# Patient Record
Sex: Male | Born: 1980 | Race: Black or African American | Hispanic: No | Marital: Single | Smoking: Never smoker
Health system: Southern US, Community
[De-identification: ages and names within clinical notes are randomized; demographics above are authoritative.]

## PROBLEM LIST (undated history)

## (undated) DIAGNOSIS — I10 Essential (primary) hypertension: Secondary | ICD-10-CM

## (undated) HISTORY — PX: COLONOSCOPY: SHX174

---

## 2016-06-04 ENCOUNTER — Emergency Department (HOSPITAL_COMMUNITY)
Admission: EM | Admit: 2016-06-04 | Discharge: 2016-06-04 | Disposition: A | Discharge status: Home / Self Care | Payer: Self-pay | Attending: Emergency Medicine | Admitting: Emergency Medicine

## 2016-06-04 ENCOUNTER — Emergency Department (HOSPITAL_COMMUNITY): Payer: Self-pay

## 2016-06-04 ENCOUNTER — Encounter (HOSPITAL_COMMUNITY): Payer: Self-pay | Admitting: Emergency Medicine

## 2016-06-04 DIAGNOSIS — M7989 Other specified soft tissue disorders: Secondary | ICD-10-CM | POA: Insufficient documentation

## 2016-06-04 DIAGNOSIS — W208XXA Other cause of strike by thrown, projected or falling object, initial encounter: Secondary | ICD-10-CM | POA: Insufficient documentation

## 2016-06-04 DIAGNOSIS — Y990 Civilian activity done for income or pay: Secondary | ICD-10-CM | POA: Insufficient documentation

## 2016-06-04 DIAGNOSIS — Y9389 Activity, other specified: Secondary | ICD-10-CM | POA: Insufficient documentation

## 2016-06-04 DIAGNOSIS — R609 Edema, unspecified: Secondary | ICD-10-CM

## 2016-06-04 DIAGNOSIS — Y929 Unspecified place or not applicable: Secondary | ICD-10-CM | POA: Insufficient documentation

## 2016-06-04 DIAGNOSIS — S99922A Unspecified injury of left foot, initial encounter: Secondary | ICD-10-CM | POA: Insufficient documentation

## 2016-06-04 MED ORDER — LIDOCAINE HCL (PF) 1 % IJ SOLN
5.0000 mL | Freq: Once | INTRAMUSCULAR | Status: AC
  Administered 2016-06-04: 5 mL via INTRADERMAL
  Filled 2016-06-04: qty 30

## 2016-06-04 MED ORDER — IBUPROFEN 600 MG PO TABS: 600.0000 mg | ORAL_TABLET | Freq: Four times a day (QID) | ORAL | 0 refills | Status: AC | PRN

## 2016-06-04 MED ORDER — IBUPROFEN 800 MG PO TABS
800.0000 mg | ORAL_TABLET | Freq: Once | ORAL | Status: AC
  Administered 2016-06-04: 800 mg via ORAL
  Filled 2016-06-04: qty 1

## 2016-06-04 MED ORDER — CIPROFLOXACIN HCL 500 MG PO TABS: 500.0000 mg | ORAL_TABLET | Freq: Two times a day (BID) | ORAL | 0 refills | Status: AC

## 2016-06-04 NOTE — Discharge Instructions (Signed)
Take your antibiotics as prescribed until you have completed the prescription. He may continue taking Tylenol or ibuprofen as prescribed over-the-counter as needed for pain relief. Keep wound clean using Dial antibacterial soap and water, pat dry.  Please follow up with a primary care provider from the Resource Guide provided below in one week for wound recheck.  Please return to the Emergency Department if symptoms worsen or new onset of fever, redness, swelling, warmth, drainage, numbness, tingling, weakness.

## 2016-06-04 NOTE — ED Provider Notes (Signed)
[facility]-EMERGENCY DEPT Provider Note   CSN: [id number] Arrival date & time: 06/04/16  2005     History   Chief Complaint Chief Complaint  Patient presents with  . Toe Injury    HPI Anthony Bradford is a 35 y.o. male.  Patient is a [age] year old male with no prior past medical history presents the ED with complaint of left great toe pain, onset 2 days. Patient reports 3 days ago at work he dropped a cement block on his left foot that landed on his left big toe. He reports over the past 2 days he has had worsening pain and swelling to his left great toe. Patient states he has been taking ibuprofen at home with mild intermittent relief and states he has been soaking his foot in Epsom salt. Denies fever, drainage, bleeding, redness, warmth, numbness, tingling, weakness. Tetanus up-to-date.      History reviewed. No pertinent past medical history.  There are no active problems to display for this patient.   History reviewed. No pertinent surgical history.     Home Medications    Prior to Admission medications   Medication Sig Start Date End Date Taking? Authorizing Provider  acetaminophen (TYLENOL) 325 MG tablet Take 325 mg by mouth every 6 (six) hours as needed for moderate pain.   Yes Historical Provider, MD  ciprofloxacin (CIPRO) 500 MG tablet Take 1 tablet (500 mg total) by mouth 2 (two) times daily. 06/04/16   Barrett HenleNicole Elizabeth Iram Astorino, PA-C  ibuprofen (ADVIL,MOTRIN) 600 MG tablet Take 1 tablet (600 mg total) by mouth every 6 (six) hours as needed. 06/04/16   Barrett HenleNicole Elizabeth Adrena Nakamura, PA-C    Family History No family history on file.  Social History Social History  Substance Use Topics  . Smoking status: Not on file  . Smokeless tobacco: Not on file  . Alcohol use Not on file     Allergies   Review of patient's allergies indicates no known allergies.   Review of Systems Review of Systems  Constitutional: Negative for fever.  Musculoskeletal: Positive for arthralgias  (left big toe) and joint swelling.  Skin: Positive for color change and wound.  Neurological: Negative for weakness and numbness.     Physical Exam Updated Vital Signs BP 134/92 (BP Location: Left Arm)   Pulse 62   Temp 98.1 F (36.7 C) (Oral)   Resp 19   SpO2 97%   Physical Exam  Constitutional: He is oriented to person, place, and time. He appears well-developed and well-nourished.  HENT:  Head: Normocephalic and atraumatic.  Eyes: Conjunctivae and EOM are normal. Right eye exhibits no discharge. Left eye exhibits no discharge. No scleral icterus.  Neck: Normal range of motion. Neck supple.  Cardiovascular: Normal rate and intact distal pulses.   Pulmonary/Chest: Effort normal. No respiratory distress.  Abdominal: He exhibits no distension.  Musculoskeletal: Normal range of motion. He exhibits tenderness.  Blood filled bulla noted to proximal nail bed of left great toe, TTP. Subungual hematoma noted, nontender. Nail is loose on edges but otherwise grossly intact. Dec ROM of left 1st PIP joint due to pain. Sensation grossly intact. 2+ DP pulse. Pt able to stand and ambulate.  Neurological: He is alert and oriented to person, place, and time.  Skin: Skin is warm and dry.  Nursing note and vitals reviewed.      ED Treatments / Results  Labs (all labs ordered are listed, but only abnormal results are displayed) Labs Reviewed - No data to display  EKG  EKG Interpretation None       Radiology Dg Toe Great Left  Result Date: 06/04/2016 CLINICAL DATA:  Center block fell on left foot. Left big toe swelling and erythema. Purulent drainage at the nail bed. EXAM: LEFT GREAT TOE COMPARISON:  None. FINDINGS: There is soft tissue swelling of the big toe, greatest at the nailbed. There is no fracture or dislocation. No ostial axis. IMPRESSION: No fracture or dislocation of the left great toe. No evidence of active osteitis. Moderate soft tissue swelling. Electronically Signed   By:  Deatra Robinson M.D.   On: 06/04/2016 21:32    Procedures .Nerve Block Date/Time: 06/04/2016 10:43 PM Performed by: [name] Authorized by: [name]   Consent:    Consent obtained:  Verbal   Consent given by:  Patient Indications:    Indications:  Procedural anesthesia Location:    Body area:  Lower extremity   Lower extremity nerve blocked: left great toe.   Laterality:  Left Pre-procedure details:    Skin preparation:  Povidone-iodine Procedure details (see MAR for exact dosages):    Block needle gauge:  25 G   Anesthetic injected:  Lidocaine 1% w/o epi   Injection procedure:  Anatomic landmarks identified Post-procedure details:    Outcome:  Pain improved   Patient tolerance of procedure:  Procedure terminated at patient's request ..Incision and Drainage Date/Time: 06/04/2016 10:44 PM Performed by: [name] Authorized by: [name]   Consent:    Consent obtained:  Verbal   Consent given by:  Patient Location:    Type:  Bulla   Size:  2cm   Location:  Lower extremity   Lower extremity location:  Toe   Toe location:  L big toe Pre-procedure details:    Skin preparation:  Betadine Anesthesia (see MAR for exact dosages):    Anesthesia method:  Nerve block   Block location:  Left great tow digital    Block needle gauge:  25 G   Block anesthetic:  Lidocaine 1% w/o epi   Block injection procedure:  Anatomic landmarks identified   Block outcome:  Incomplete block (due to pt not tolerating entire procedure) Procedure type:    Complexity:  Simple Post-procedure details:    Patient tolerance of procedure:  Procedure terminated at patient's request   (including critical care time)  Medications Ordered in ED Medications  ibuprofen (ADVIL,MOTRIN) tablet 800 mg (800 mg Oral Given 06/04/16 2051)  lidocaine (PF) (XYLOCAINE) 1 % injection 5 mL (5 mLs Intradermal Given 06/04/16 2115)     Initial Impression /  Assessment and Plan / ED Course  I have reviewed the triage vital signs and the nursing notes.  Pertinent labs & imaging results that were available during my care of the patient were reviewed by me and considered in my medical decision making (see chart for details).  Clinical Course    Patient presents with left big toe pain that started after he dropped a cement block on his foot 2 days ago. Reports worsening swelling and bruising. Tetanus up-to-date. VSS. Exam revealed large blood filled bulla to left first proximal nail bed with subungual hematoma noted, toenail is loose but grossly intact. Left foot neurovascularly intact. Left great toe x-ray negative. Attempted to perform digital block however was unable to fully block patient's digit for I&D of bulla due to patient not tolerating procedure. I was able to I&D bulla with partial block without any complications. Dressing applied to wound. Will d/c pt  home with keflex and wound care. Suspect pt will likely lose his toe nail which I discussed with pt. Advised pt to follow up with PCP. Discussed return precautions with pt.   Final Clinical Impressions(s) / ED Diagnoses   Final diagnoses:  Swelling  Toe injury, left, initial encounter    New Prescriptions Discharge Medication List as of 06/04/2016 10:43 PM    START taking these medications   Details  ciprofloxacin (CIPRO) 500 MG tablet Take 1 tablet (500 mg total) by mouth 2 (two) times daily., Starting Fri 06/04/2016, Print    ibuprofen (ADVIL,MOTRIN) 600 MG tablet Take 1 tablet (600 mg total) by mouth every 6 (six) hours as needed., Starting Fri 06/04/2016, Print         Satira Sark Vilas, [place] 06/05/16 8119    Lorre Nick, MD 06/06/16 [phone]

## 2016-06-04 NOTE — ED Triage Notes (Signed)
Per EMS pt dropped a cement cenderblook on left foot on Wednesday. Pt soaked foot in epson salt, then put icy hot on it. Pts left big toe is now swollen, purple, and pus around the nail bed. Pt denies taking any medication for pain. Pain 9/10.

## 2016-06-04 NOTE — ED Notes (Signed)
Patient transported to DG. 

## 2016-06-04 NOTE — ED Notes (Signed)
Clean dressing applied over left big toe.

## 2016-06-04 NOTE — ED Notes (Signed)
Bed: [id number] Expected date:  Expected time:  Means of arrival:  Comments: EMS/foot injury

## 2019-03-03 ENCOUNTER — Encounter (HOSPITAL_COMMUNITY): Payer: Self-pay

## 2019-03-03 ENCOUNTER — Emergency Department (HOSPITAL_COMMUNITY)
Admission: EM | Admit: 2019-03-03 | Discharge: 2019-03-03 | Disposition: A | Discharge status: Home / Self Care | Payer: Self-pay | Attending: Emergency Medicine | Admitting: Emergency Medicine

## 2019-03-03 DIAGNOSIS — H029 Unspecified disorder of eyelid: Secondary | ICD-10-CM | POA: Insufficient documentation

## 2019-03-03 MED ORDER — ERYTHROMYCIN 5 MG/GM OP OINT
1.0000 | TOPICAL_OINTMENT | Freq: Once | OPHTHALMIC | Status: AC
Start: 2019-03-03 — End: 2019-03-03
  Administered 2019-03-03: 1 via OPHTHALMIC
  Filled 2019-03-03: qty 3.5

## 2019-03-03 NOTE — ED Triage Notes (Signed)
Pt states that for the past three days his R eye has been red and painful

## 2019-03-03 NOTE — Discharge Instructions (Addendum)
Thank you for allowing me to care for you today in the Emergency Department.   Apply 1/2 inch ribbon ointment along the lower eyelid every 6 hours while you are awake for the next 4 to 5 days.  Apply a warm washcloth or compress to the right eye for 15 to 20 minutes at least 3-4 times a day.  Try to gently massage the area where the "bump" is located on your eye.  Most likely, this is a clogged tear duct and should resolve with just warm compresses and massages alone.  If the bump does not go away after using the massages and warm compress, call and schedule follow-up appointment with Dr. Allena Katz.  Dr. Allena Katz is an eye doctor.  Your blood pressure was high today.  Please have it rechecked within the next 1 to 2 weeks.  Return to the emergency department if you develop fevers, chills, and a area around the eye gets very red and hot to the touch, if you start having thick, mucus-like drainage constantly coming out of the eye, if you start to have severe pain with movement of the eye in all directions, or other new, concerning symptoms.

## 2019-03-03 NOTE — ED Provider Notes (Signed)
[facility] Provider Note   CSN: [id number] Arrival date & time: 03/03/19  2212    History   Chief Complaint Chief Complaint  Patient presents with  . Conjunctivitis    HPI Anthony Bradford is a 38 y.o. male presents with a chief complaint of right eye pain.  Patient reports that approximately 3 days ago that some of his significant other's hair "grease" product ended up in his right eye.  He reports that he immediately wrapped to the eye and then flushed it out with water.  He reports that he then noticed a "bump" at the medial aspect of the eye.  Denies a history of similar.  He reports the nontender unless he is pressing on it.  He treated the bump by running warm water over the area. He has been rubbing the area as it has been irritated. He noted a "stinging" pain that lasts for approximately a second when he looks at the left, toward his nose, which then immediately resolves.   He denies fever, chills, eye redness or drainage.  No left eye complaints.  No facial swelling, diplopia, blurred vision, or foreign body sensation.  He does not recall if he has produced any tears over the last few days.  He does not wear contacts or glasses.      The history is provided by the patient. No language interpreter was used.    History reviewed. No pertinent past medical history.  There are no active problems to display for this patient.   History reviewed. No pertinent surgical history.      Home Medications    Prior to Admission medications   Medication Sig Start Date End Date Taking? Authorizing Provider  acetaminophen (TYLENOL) 325 MG tablet Take 325 mg by mouth every 6 (six) hours as needed for moderate pain.    [provider]  ciprofloxacin (CIPRO) 500 MG tablet Take 1 tablet (500 mg total) by mouth 2 (two) times daily. 06/04/16   Barrett HenleNadeau, Nicole Elizabeth, PA-C  ibuprofen (ADVIL,MOTRIN) 600 MG tablet Take 1 tablet (600 mg total) by  mouth every 6 (six) hours as needed. 06/04/16   Barrett HenleNadeau, Nicole Elizabeth, PA-C    Family History No family history on file.  Social History Social History   Tobacco Use  . Smoking status: Not on file  Substance Use Topics  . Alcohol use: Not on file  . Drug use: Not on file     Allergies   Patient has no known allergies.   Review of Systems Review of Systems  Constitutional: Negative for activity change, chills and fever.  HENT: Negative for congestion, facial swelling and sore throat.   Eyes: Positive for pain. Negative for photophobia, discharge, redness, itching and visual disturbance.  Gastrointestinal: Negative for diarrhea, nausea and vomiting.  Musculoskeletal: Negative for back pain.  Skin: Negative for rash.       Mass  Neurological: Negative for dizziness, weakness and numbness.   Physical Exam Updated Vital Signs BP (!) 174/113 (BP Location: Right Arm)   Pulse 87   Temp 98.9 F (37.2 C)   Resp 18   SpO2 98%   Physical Exam Vitals signs and nursing note reviewed.  Constitutional:      Appearance: He is well-developed.  HENT:     Head: Normocephalic.  Eyes:     General: Lids are normal. Lids are everted, no foreign bodies appreciated. Vision grossly intact.        Right eye: No  foreign body or discharge.        Left eye: No foreign body or discharge.     Extraocular Movements: Extraocular movements intact.     Conjunctiva/sclera: Conjunctivae normal.     Right eye: Right conjunctiva is not injected. No chemosis, exudate or hemorrhage.    Left eye: Left conjunctiva is not injected. No chemosis, exudate or hemorrhage.    Pupils: Pupils are equal, round, and reactive to light.     Funduscopic exam:    Right eye: No hemorrhage or exudate. Red reflex present.        Left eye: No hemorrhage or exudate. Red reflex present.    Visual Fields: Right eye visual fields normal and left eye visual fields normal.      Comments: 1-2 mm hard, mobile mass noted near  the medial canthus of the right eye. Minimal discomfort with palpation. No tearing or purulence noted with massage.  No surrounding erythema, warmth, or edema.  There is no fluctuance or induration.   Neck:     Musculoskeletal: Neck supple.  Cardiovascular:     Rate and Rhythm: Normal rate and regular rhythm.     Heart sounds: No murmur.  Pulmonary:     Effort: Pulmonary effort is normal.  Abdominal:     General: There is no distension.     Palpations: Abdomen is soft.  Skin:    General: Skin is warm and dry.  Neurological:     Mental Status: He is alert.  Psychiatric:        Behavior: Behavior normal.      ED Treatments / Results  Labs (all labs ordered are listed, but only abnormal results are displayed) Labs Reviewed - No data to display  EKG None  Radiology No results found.  Procedures Procedures (including critical care time)  Medications Ordered in ED Medications  erythromycin ophthalmic ointment 1 application (has no administration in time range)     Initial Impression / Assessment and Plan / ED Course  I have reviewed the triage vital signs and the nursing notes.  Pertinent labs & imaging results that were available during my care of the patient were reviewed by me and considered in my medical decision making (see chart for details).        38 year old male with no pertinent past medical history presenting with a mobile mass near the medial canthus of the right eye that he first noticed 3 days ago after he accidentally rubbed some of his significant other's hair product in his eye.  No constitutional symptoms.  On exam, there is a 1 to 2 mm circular hard mobile near the medial canthus.  Conjunctive is normal.  Extraocular movements are intact.  Pupils are equal round and reactive.  He is not endorsing any changes to his vision or foreign body sensation.  Differential diagnosis includes dacryocystitis, dermoid cyst, basal cell carcinoma.   I have a lower  suspicion for infection given that there is no fluctuance or induration, including septal or preseptal cellulitis.  Will discharge with warm compresses and massages.  Will cover for infection with erythromycin since the patient does not wear contact lenses.  We will provide the patient with a follow-up to ophthalmology if symptoms do not resolve in the next 4 to 5 days.  He was also given strict return precautions to the ER.  He is hemodynamically stable and in no acute distress.  He was also advised to have his blood pressure rechecked in the next  week.  Doubt hypertensive urgency or emergency as the patient is asymptomatic.  Safe for discharge home with outpatient follow-up at this time.  Final Clinical Impressions(s) / ED Diagnoses   Final diagnoses:  Lesion of canthus    ED Discharge Orders    None       Barkley Boards, PA-C 03/03/19 2329    Shaune Pollack, MD 03/05/19 1027

## 2019-08-23 ENCOUNTER — Telehealth: Payer: Self-pay | Admitting: Physician Assistant

## 2019-08-23 ENCOUNTER — Emergency Department (HOSPITAL_COMMUNITY)
Admission: EM | Admit: 2019-08-23 | Discharge: 2019-08-23 | Disposition: A | Discharge status: Home / Self Care | Payer: Self-pay | Attending: Emergency Medicine | Admitting: Emergency Medicine

## 2019-08-23 ENCOUNTER — Emergency Department (HOSPITAL_COMMUNITY): Payer: Self-pay

## 2019-08-23 ENCOUNTER — Other Ambulatory Visit: Payer: Self-pay

## 2019-08-23 ENCOUNTER — Encounter: Payer: Self-pay | Admitting: Physician Assistant

## 2019-08-23 ENCOUNTER — Encounter (HOSPITAL_COMMUNITY): Payer: Self-pay | Admitting: Emergency Medicine

## 2019-08-23 DIAGNOSIS — K625 Hemorrhage of anus and rectum: Secondary | ICD-10-CM | POA: Insufficient documentation

## 2019-08-23 DIAGNOSIS — R1030 Lower abdominal pain, unspecified: Secondary | ICD-10-CM | POA: Insufficient documentation

## 2019-08-23 LAB — CBC
HCT: 34.8 % — ABNORMAL LOW (ref 39.0–52.0)
Hemoglobin: 11.1 g/dL — ABNORMAL LOW (ref 13.0–17.0)
MCH: 27.8 pg (ref 26.0–34.0)
MCHC: 31.9 g/dL (ref 30.0–36.0)
MCV: 87.2 fL (ref 80.0–100.0)
Platelets: 198 10*3/uL (ref 150–400)
RBC: 3.99 MIL/uL — ABNORMAL LOW (ref 4.22–5.81)
RDW: 15.8 % — ABNORMAL HIGH (ref 11.5–15.5)
WBC: 5.6 10*3/uL (ref 4.0–10.5)
nRBC: 0 % (ref 0.0–0.2)

## 2019-08-23 LAB — URINALYSIS, ROUTINE W REFLEX MICROSCOPIC
Bilirubin Urine: NEGATIVE
Glucose, UA: NEGATIVE mg/dL
Hgb urine dipstick: NEGATIVE
Ketones, ur: NEGATIVE mg/dL
Leukocytes,Ua: NEGATIVE
Nitrite: NEGATIVE
Protein, ur: NEGATIVE mg/dL
Specific Gravity, Urine: 1.027 (ref 1.005–1.030)
pH: 5 (ref 5.0–8.0)

## 2019-08-23 LAB — COMPREHENSIVE METABOLIC PANEL
ALT: 18 U/L (ref 0–44)
AST: 26 U/L (ref 15–41)
Albumin: 3.6 g/dL (ref 3.5–5.0)
Alkaline Phosphatase: 51 U/L (ref 38–126)
Anion gap: 10 (ref 5–15)
BUN: 6 mg/dL (ref 6–20)
CO2: 25 mmol/L (ref 22–32)
Calcium: 9 mg/dL (ref 8.9–10.3)
Chloride: 104 mmol/L (ref 98–111)
Creatinine, Ser: 1.12 mg/dL (ref 0.61–1.24)
GFR calc Af Amer: >60 mL/min (ref >60)
GFR calc non Af Amer: >60 mL/min (ref >60)
Glucose, Bld: 146 mg/dL — ABNORMAL HIGH (ref 70–99)
Potassium: 3.6 mmol/L (ref 3.5–5.1)
Sodium: 139 mmol/L (ref 135–145)
Total Bilirubin: 0.4 mg/dL (ref 0.3–1.2)
Total Protein: 6.2 g/dL — ABNORMAL LOW (ref 6.5–8.1)

## 2019-08-23 LAB — TYPE AND SCREEN
ABO/RH(D): A POS
Antibody Screen: NEGATIVE

## 2019-08-23 LAB — ABO/RH: ABO/RH(D): A POS

## 2019-08-23 LAB — POC OCCULT BLOOD, ED: Fecal Occult Bld: POSITIVE — AB

## 2019-08-23 MED ORDER — IOHEXOL 300 MG/ML  SOLN
100.0000 mL | Freq: Once | INTRAMUSCULAR | Status: AC
  Administered 2019-08-23: 09:00:00 100 mL via INTRAVENOUS

## 2019-08-23 MED ORDER — SENNOSIDES-DOCUSATE SODIUM 8.6-50 MG PO TABS: 1.0000 | ORAL_TABLET | Freq: Every evening | ORAL | 0 refills | Status: AC | PRN

## 2019-08-23 MED ORDER — HYDROCORTISONE ACETATE 25 MG RE SUPP: 25.0000 mg | Freq: Two times a day (BID) | RECTAL | 0 refills | Status: AC

## 2019-08-23 NOTE — ED Triage Notes (Addendum)
Pt reports intermittent lower abd cramping/pain that has been going on for a few years but has gotten worse over the past two weeks. Pt reports once the pain happens he starts having bright red and dark red blood out of his rectum with no bm. Pt reports he has been bleeding constantly for two weeks and now starting to feel generalized weakness. 10/10 rectal pain. No n/v/d. No urinary complaints. Pt has been checked by doctors, had colonoscopies done with no answers.

## 2019-08-23 NOTE — Discharge Instructions (Signed)
You were seen in the emergency department today with rectal bleeding and abdominal pain.  Your CT scan and lab work is reassuring.  I am treating you for hemorrhoids with steroid suppository and will have you start taking medication for constipation.  If your bleeding significantly worsens or you become lightheaded, shortness of breath, or nearly passing out I would like for you to return to the emergency department.  I have placed a referral to gastroenterology.  They may require that this comes from your primary care doctor but please call today to schedule the next available appointment.

## 2019-08-23 NOTE — Telephone Encounter (Signed)
.  A user error has taken place: error

## 2019-08-23 NOTE — ED Notes (Signed)
Patient transported to CT 

## 2019-08-23 NOTE — ED Provider Notes (Signed)
Emergency Department Provider Note   I have reviewed the triage vital signs and the nursing notes.   HISTORY  Chief Complaint Abdominal Pain and GI Bleeding   HPI Anthony Bradford is a 38 y.o. male with past history of chronic rectal bleeding from unknown source presents to the emergency department with breakthrough rectal bleeding and rectal pain.  Patient states that the problems been going on for "years" on and off.  He describes initial discomfort in his abdomen like he needs to have a bowel movement.  He states he goes to the bathroom and moves his bowels but notices a large amount of bright red blood.  He has pain afterwards with bleeding and discomfort.  Patient recently moved from [place] and states that prior to moving here he had seen a gastroenterologist and had colonoscopies.  He states that no one could find a clear source for his bleeding.  Patient states that symptoms returned 2 to 3 days ago and have been persistent.  He has developed some generalized weakness and occasional tingling into both legs.  No weakness or numbness symptoms.  Denies chest pain or shortness of breath.  No vomiting or diarrhea.   History reviewed. No pertinent past medical history.  There are no active problems to display for this patient.   Past Surgical History:  Procedure Laterality Date   COLONOSCOPY      Allergies Patient has no known allergies.  No family history on file.  Social History Social History   Tobacco Use   Smoking status: Never Smoker   Smokeless tobacco: Never Used  Substance Use Topics   Alcohol use: Never    Frequency: Never   Drug use: Never    Review of Systems  Constitutional: No fever/chills Eyes: No visual changes. ENT: No sore throat. Cardiovascular: Denies chest pain. Respiratory: Denies shortness of breath. Gastrointestinal: Cramping abdominal pain. Positive rectal pain. No nausea, no vomiting.  No diarrhea.  No constipation. Positive BRBPR.    Genitourinary: Negative for dysuria. Musculoskeletal: Negative for back pain. Skin: Negative for rash. Neurological: Negative for headaches, focal weakness or numbness.  10-point ROS otherwise negative.  ____________________________________________   PHYSICAL EXAM:  VITAL SIGNS: ED Triage Vitals  Enc Vitals Group     BP 08/23/19 0225 (!) 159/98     Pulse Rate 08/23/19 0225 88     Resp 08/23/19 0225 20     Temp 08/23/19 0225 98.3 F (36.8 C)     Temp Source 08/23/19 0225 Oral     SpO2 08/23/19 0225 98 %     Weight 08/23/19 0242 200 lb (90.7 kg)     Height 08/23/19 0242 6\' 6"  (1.981 m)   Constitutional: Alert and oriented. Well appearing and in no acute distress. Eyes: Conjunctivae are normal.  Head: Atraumatic. Nose: No congestion/rhinnorhea. Mouth/Throat: Mucous membranes are moist.   Neck: No stridor.   Cardiovascular: Normal rate, regular rhythm. Good peripheral circulation. Grossly normal heart sounds.   Respiratory: Normal respiratory effort.  No retractions. Lungs CTAB. Gastrointestinal: Soft with mild LLQ abdominal tenderness. No rebound or guarding. No distention. Rectal exam performed with nurse tech chaperone and with patient's verbal consent. Small, non-thrombosed hemorrhoid with no visible active bleeding. BPB on exam finger, however. No fissures. No visible peri-rectal abscess.  Musculoskeletal: No lower extremity tenderness nor edema. No gross deformities of extremities. Neurologic:  Normal speech and language.  Skin:  Skin is warm, dry and intact. No rash noted.   ____________________________________________   (  all labs ordered are listed, but only abnormal results are displayed)  Labs Reviewed  COMPREHENSIVE METABOLIC PANEL - Abnormal; Notable for the following components:      Result Value   Glucose, Bld 146 (*)    Total Protein 6.2 (*)    All other components within normal limits  CBC - Abnormal; Notable for the following components:   RBC  3.99 (*)    Hemoglobin 11.1 (*)    HCT 34.8 (*)    RDW 15.8 (*)    All other components within normal limits  URINALYSIS, ROUTINE W REFLEX MICROSCOPIC - Abnormal; Notable for the following components:   APPearance HAZY (*)    All other components within normal limits  POC OCCULT BLOOD, ED - Abnormal; Notable for the following components:   Fecal Occult Bld POSITIVE (*)    All other components within normal limits  TYPE AND SCREEN  ABO/RH   ____________________________________________  RADIOLOGY  Ct Abdomen Pelvis W Contrast  Result Date: 08/23/2019 CLINICAL DATA:  Acute abdominal pain. Generalized rectal pain and GI bleeding. GI bleeding symptoms for 2 weeks. EXAM: CT ABDOMEN AND PELVIS WITH CONTRAST TECHNIQUE: Multidetector CT imaging of the abdomen and pelvis was performed using the standard protocol following bolus administration of intravenous contrast. CONTRAST:  <See Chart> OMNIPAQUE IOHEXOL 300 MG/ML  SOLN COMPARISON:  None. FINDINGS: Lower chest: Clear lung bases. Hepatobiliary: No focal liver abnormality is seen. No gallstones, gallbladder wall thickening, or biliary dilatation. Pancreas: Unremarkable. No pancreatic ductal dilatation or surrounding inflammatory changes. Spleen: Normal in size without focal abnormality. Adrenals/Urinary Tract: Adrenal glands are unremarkable. Kidneys are normal, without renal calculi, focal lesion, or hydronephrosis. Bladder is unremarkable. Stomach/Bowel: Moderate increased stool noted throughout the colon. No colonic wall thickening or inflammation. No mass or abnormality noted of the rectum. Stomach and small bowel are unremarkable. Normal appendix visualized. Vascular/Lymphatic: Minimal aortic atherosclerosis. No other vascular abnormality. No enlarged lymph nodes. Reproductive: Unremarkable. Other: No abdominal wall hernia or abnormality. Trace free fluid in the posterior pelvic recess. Musculoskeletal: No acute or significant osseous findings.  IMPRESSION: 1. No acute findings. 2. Moderate increased stool burden throughout the colon. No bowel inflammation. No CT abnormality of the rectum. Electronically Signed   By: Lajean Manes M.D.   On: 08/23/2019 09:33    ____________________________________________   PROCEDURES  Procedure(s) performed:   Procedures  None  ____________________________________________   INITIAL IMPRESSION / ASSESSMENT AND PLAN / ED COURSE  Pertinent labs & imaging results that were available during my care of the patient were reviewed by me and considered in my medical decision making (see chart for details).   Patient presents to the emergency department with acute on chronic rectal bleeding.  Mostly bright red on my exam without melena.  There is a small, nonthrombosed hemorrhoid visible which could be the source of bleeding however patient does have some left lower quadrant tenderness as well.  Plan for CT imaging to evaluate for underlying colitis.  If negative, plan to treat hemorrhoids and refer to GI.  Patient's hemoglobin is 11.1 and not requiring PRBC transfusion at this time.  Patient is not tachycardic or hypotensive.   09:55 AM  CT scan reviewed.  There is evidence of constipation but no colitis type picture or obvious abscess.  Suspicion for infectious process is exceedingly low.  Plan for steroid suppository and constipation management at home as hemorrhoids may be playing a role here.  I placed a referral to gastroenterology and asked the patient to  call today to schedule the next available appointment.  He is also establishing care with a primary care doctor.  Discussed ED return precautions in detail. Patient comfortable with the plan at discharge.  ____________________________________________  FINAL CLINICAL IMPRESSION(S) / ED DIAGNOSES  Final diagnoses:  Rectal bleeding  Lower abdominal pain     MEDICATIONS GIVEN DURING THIS VISIT:  Medications  iohexol (OMNIPAQUE) 300 MG/ML  solution 100 mL (100 mLs Intravenous Contrast Given 08/23/19 0912)     NEW OUTPATIENT MEDICATIONS STARTED DURING THIS VISIT:  New Prescriptions   HYDROCORTISONE (ANUSOL-HC) 25 MG SUPPOSITORY    Place 1 suppository (25 mg total) rectally 2 (two) times daily for 7 days.   SENNA-DOCUSATE (SENOKOT-S) 8.6-50 MG TABLET    Take 1 tablet by mouth at bedtime as needed for mild constipation or moderate constipation.    Note:  This document was prepared using Dragon voice recognition software and may include unintentional dictation errors.  Alona BeneJoshua Atalaya Zappia, MD, [facility] Emergency Medicine    Raymon Schlarb, Arlyss RepressJoshua G, MD 08/23/19 [phone]

## 2019-09-04 ENCOUNTER — Ambulatory Visit: Payer: Self-pay | Admitting: Physician Assistant

## 2019-12-16 ENCOUNTER — Encounter (HOSPITAL_COMMUNITY): Payer: Self-pay | Admitting: Emergency Medicine

## 2019-12-16 ENCOUNTER — Emergency Department (HOSPITAL_COMMUNITY)
Admission: EM | Admit: 2019-12-16 | Discharge: 2019-12-17 | Disposition: A | Discharge status: Home / Self Care | Payer: Self-pay | Attending: Emergency Medicine | Admitting: Emergency Medicine

## 2019-12-16 ENCOUNTER — Emergency Department (HOSPITAL_COMMUNITY): Payer: Self-pay

## 2019-12-16 ENCOUNTER — Other Ambulatory Visit: Payer: Self-pay

## 2019-12-16 DIAGNOSIS — I10 Essential (primary) hypertension: Secondary | ICD-10-CM | POA: Insufficient documentation

## 2019-12-16 DIAGNOSIS — R0789 Other chest pain: Secondary | ICD-10-CM | POA: Insufficient documentation

## 2019-12-16 HISTORY — DX: Essential (primary) hypertension: I10

## 2019-12-16 LAB — BASIC METABOLIC PANEL
Anion gap: 10 (ref 5–15)
BUN: 6 mg/dL (ref 6–20)
CO2: 28 mmol/L (ref 22–32)
Calcium: 9.6 mg/dL (ref 8.9–10.3)
Chloride: 100 mmol/L (ref 98–111)
Creatinine, Ser: 1.03 mg/dL (ref 0.61–1.24)
GFR calc Af Amer: >60 mL/min (ref >60)
GFR calc non Af Amer: >60 mL/min (ref >60)
Glucose, Bld: 119 mg/dL — ABNORMAL HIGH (ref 70–99)
Potassium: 3.6 mmol/L (ref 3.5–5.1)
Sodium: 138 mmol/L (ref 135–145)

## 2019-12-16 LAB — CBC
HCT: 41.5 % (ref 39.0–52.0)
Hemoglobin: 13.3 g/dL (ref 13.0–17.0)
MCH: 28.1 pg (ref 26.0–34.0)
MCHC: 32 g/dL (ref 30.0–36.0)
MCV: 87.6 fL (ref 80.0–100.0)
Platelets: 237 10*3/uL (ref 150–400)
RBC: 4.74 MIL/uL (ref 4.22–5.81)
RDW: 15 % (ref 11.5–15.5)
WBC: 5.3 10*3/uL (ref 4.0–10.5)
nRBC: 0 % (ref 0.0–0.2)

## 2019-12-16 LAB — TROPONIN I (HIGH SENSITIVITY): Troponin I (High Sensitivity): 11 ng/L (ref <18)

## 2019-12-16 MED ORDER — SODIUM CHLORIDE 0.9% FLUSH: 3.0000 mL | Freq: Once | INTRAVENOUS | Status: DC

## 2019-12-16 NOTE — ED Triage Notes (Signed)
Pt reports left sided chest pain (heaviness or cramping) radiating to left arm and neck. States "I had a heart thing but I beat it." Unable to get any other info.  Started a week ago but "got real bad tonight."  Mother passed away last week b/c of "heart problems"

## 2019-12-17 NOTE — Discharge Instructions (Signed)
You may use over-the-counter Motrin (Ibuprofen), Acetaminophen (Tylenol), topical muscle creams such as SalonPas, Icy Hot, Bengay, etc. Please stretch, apply heat, and have massage therapy for additional assistance. ° °

## 2019-12-17 NOTE — ED Provider Notes (Signed)
[facility] EMERGENCY DEPARTMENT Provider Note  CSN: [id number] Arrival date & time: 12/16/19 2145  Chief Complaint(s) Chest Pain  HPI Anthony Bradford is a 39 y.o. male with a history of hypertension who presents to the emergency department with 1-1/2 weeks of constant left upper back and left upper chest pain that has been fluctuating in nature.  Exacerbated with range of motion of the left arm.  Pain now radiating up his neck down into his arm and associated with intermittent paresthesias.  Patient denies any trauma.  No shortness of breath.  No nausea or vomiting.  Pain is not exertional.  Some improvement with massages.  No other alleviating or aggravating factors.  No recent fevers or infections.  No cough or congestion. No other physical complaints.  HPI  Past Medical History Past Medical History:  Diagnosis Date  . Hypertension    There are no problems to display for this patient.  Home Medication(s) Prior to Admission medications   Medication Sig Start Date End Date Taking? Authorizing Provider  ciprofloxacin (CIPRO) 500 MG tablet Take 1 tablet (500 mg total) by mouth 2 (two) times daily. Patient not taking: Reported on 12/16/2019 06/04/16   [name], PA-C  ibuprofen (ADVIL,MOTRIN) 600 MG tablet Take 1 tablet (600 mg total) by mouth every 6 (six) hours as needed. Patient not taking: Reported on 12/16/2019 06/04/16   [name], PA-C  senna-docusate (SENOKOT-S) 8.6-50 MG tablet Take 1 tablet by mouth at bedtime as needed for mild constipation or moderate constipation. Patient not taking: Reported on 12/16/2019 08/23/19   Long, [name], MD                                                                                                                                    Past Surgical History Past Surgical History:  Procedure Laterality Date  . COLONOSCOPY     Family History No family history on file.  Social History Social History    Tobacco Use  . Smoking status: Never Smoker  . Smokeless tobacco: Never Used  Substance Use Topics  . Alcohol use: Never  . Drug use: Never   Allergies Patient has no known allergies.  Review of Systems Review of Systems All other systems are reviewed and are negative for acute change except as noted in the HPI  Physical Exam Vital Signs  I have reviewed the triage vital signs BP 140/86   Pulse 68   Temp 97.6 F (36.4 C) (Oral)   Resp 17   Ht 6\' 6"  (1.981 m)   Wt 90.7 kg   SpO2 98%   BMI 23.11 kg/m   Physical Exam Vitals reviewed.  Constitutional:      General: He is not in acute distress.    Appearance: He is well-developed. He is not diaphoretic.  HENT:     Head: Normocephalic and atraumatic.     Nose: Nose normal.  Eyes:     General: No scleral icterus.       Right eye: No discharge.        Left eye: No discharge.     Conjunctiva/sclera: Conjunctivae normal.     Pupils: Pupils are equal, round, and reactive to light.  Cardiovascular:     Rate and Rhythm: Normal rate and regular rhythm.     Heart sounds: No murmur. No friction rub. No gallop.   Pulmonary:     Effort: Pulmonary effort is normal. No respiratory distress.     Breath sounds: Normal breath sounds. No stridor. No rales.  Chest:     Chest wall: Tenderness present.    Abdominal:     General: There is no distension.     Palpations: Abdomen is soft.     Tenderness: There is no abdominal tenderness.  Musculoskeletal:     Cervical back: Normal range of motion and neck supple.     Thoracic back: Spasms and tenderness present. No bony tenderness.       Back:  Skin:    General: Skin is warm and dry.     Findings: No erythema or rash.  Neurological:     Mental Status: He is alert and oriented to person, place, and time.     ED Results and Treatments Labs (all labs ordered are listed, but only abnormal results are displayed) Labs Reviewed  BASIC METABOLIC PANEL - Abnormal; Notable for the  following components:      Result Value   Glucose, Bld 119 (*)    All other components within normal limits  CBC  TROPONIN I (HIGH SENSITIVITY)  TROPONIN I (HIGH SENSITIVITY)                                                                                                                         EKG  EKG Interpretation  Date/Time:  Sunday December 16 2019 22:13:51 EST Ventricular Rate:  80 PR Interval:  144 QRS Duration: 94 QT Interval:  374 QTC Calculation: 431 R Axis:   87 Text Interpretation: Normal sinus rhythm with sinus arrhythmia Minimal voltage criteria for LVH, may be normal variant ( Sokolow-Lyon ) Borderline ECG NO STEMI. No old tracing to compare Confirmed by Addison Lank [phone]) on 12/16/2019 11:44:48 PM      Radiology DG Chest 2 View  Result Date: 12/16/2019 CLINICAL DATA:  39 year old male with chest pain. EXAM: CHEST - 2 VIEW COMPARISON:  None. FINDINGS: The heart size and mediastinal contours are within normal limits. Both lungs are clear. The visualized skeletal structures are unremarkable. IMPRESSION: No active cardiopulmonary disease. Electronically Signed   By: Anner Crete M.D.   On: 12/16/2019 22:52    Pertinent labs & imaging results that were available during my care of the patient were reviewed by me and considered in my medical decision making (see chart for details).  Medications Ordered in ED Medications  sodium chloride flush (NS) 0.9 % injection 3 mL (has  no administration in time range)                                                                                                                                    Procedures Procedures  (including critical care time)  Medical Decision Making / ED Course I have reviewed the nursing notes for this encounter and the patient's prior records (if available in EHR or on provided paperwork).   Anthony Bradford was evaluated in Emergency Department on 12/17/2019 for the symptoms described in the history of  present illness. He was evaluated in the context of the global COVID-19 pandemic, which necessitated consideration that the patient might be at risk for infection with the SARS-CoV-2 virus that causes COVID-19. Institutional protocols and algorithms that pertain to the evaluation of patients at risk for COVID-19 are in a state of rapid change based on information released by regulatory bodies including the CDC and federal and state organizations. These policies and algorithms were followed during the patient's care in the ED.  1-1/2 weeks of constant highly atypical chest pain.  Inconsistent with ACS.  EKG without acute ischemic changes or evidence of pericarditis.  Labs drawn in triage with negative troponin.  Given duration of pain feel this is sufficient to rule out ACS.  Presentation not classic for aortic dissection or esophageal perforation.  Low suspicion for pulmonary embolism. Chest x-ray without evidence suggestive of pneumonia, pneumothorax, pneumomediastinum.  No abnormal contour of the mediastinum to suggest dissection. No evidence of acute injuries.  Most consistent with muscular strain/spasm of the left shoulder girdle muscles.  Supportive management recommended.      Final Clinical Impression(s) / ED Diagnoses Final diagnoses:  Chest wall pain   The patient appears reasonably screened and/or stabilized for discharge and I doubt any other medical condition or other [facility] requiring further screening, evaluation, or treatment in the ED at this time prior to discharge. Safe for discharge with strict return precautions.  Disposition: Discharge  Condition: Good  I have discussed the results, Dx and Tx plan with the patient/family who expressed understanding and agree(s) with the plan. Discharge instructions discussed at length. The patient/family was given strict return precautions who verbalized understanding of the instructions. No further questions at time of discharge.    ED  Discharge Orders    None       Follow Up: Primary care provider  Schedule an appointment as soon as possible for a visit  If you do not have a primary care physician, contact [facility] at [phone] for referral     This chart was dictated using voice recognition software.  Despite best efforts to proofread,  errors can occur which can change the documentation meaning.   Nira Conn, MD 12/17/19 [phone]

## 2019-12-17 NOTE — ED Notes (Signed)
Pt verbalized understanding of d/c instructions, follow up care, and s/s requiring return to ed. Pt educated on stretches and had no additional questions at this time

## 2020-11-05 ENCOUNTER — Other Ambulatory Visit: Payer: Self-pay

## 2020-11-05 ENCOUNTER — Encounter (HOSPITAL_COMMUNITY): Payer: Self-pay | Admitting: *Deleted

## 2020-11-05 ENCOUNTER — Emergency Department (HOSPITAL_COMMUNITY)
Admission: EM | Admit: 2020-11-05 | Discharge: 2020-11-05 | Disposition: A | Discharge status: Home / Self Care | Payer: BC Managed Care – PPO | Attending: Emergency Medicine | Admitting: Emergency Medicine

## 2020-11-05 DIAGNOSIS — Z7689 Persons encountering health services in other specified circumstances: Secondary | ICD-10-CM

## 2020-11-05 DIAGNOSIS — R197 Diarrhea, unspecified: Secondary | ICD-10-CM | POA: Insufficient documentation

## 2020-11-05 DIAGNOSIS — R1110 Vomiting, unspecified: Secondary | ICD-10-CM | POA: Insufficient documentation

## 2020-11-05 DIAGNOSIS — I10 Essential (primary) hypertension: Secondary | ICD-10-CM | POA: Diagnosis not present

## 2020-11-05 NOTE — ED Triage Notes (Signed)
Pt had abd pain and vomiting last night due to overeating. Ate approx 3 meals back to back. Due to vomiting last night employer told him he has to stay out of work and obtain a work note. All abd symptoms have resolved.

## 2020-11-05 NOTE — Discharge Instructions (Addendum)
Use Imodium or Kaopectate if needed for diarrhea.  Use Tylenol if needed for pain or fever.  Do not return to work until all of your diarrhea has resolved.  See the doctor of your choice for problems.

## 2020-11-05 NOTE — ED Provider Notes (Signed)
[facility]-EMERGENCY DEPT Provider Note   CSN: [id number] Arrival date & time: 11/05/20  1328     History Chief Complaint  Patient presents with  . Letter for School/Work    Anthony Bradford is a 40 y.o. male.  HPI Patient is here for evaluation of vomiting and diarrhea.  Yesterday after eating 3 meals in quick succession, he had a single episode of vomiting.  Since then he has had 3 or 4 episodes of loose brown stool.  He attributes both of these discomforts to "binge eating."  He denies blood in either emesis or stool.  He denies fever, chills, abdominal pain, weakness or dizziness.  He has had prior similar problems.  There are no other known modifying factors.    Past Medical History:  Diagnosis Date  . Hypertension     There are no problems to display for this patient.   Past Surgical History:  Procedure Laterality Date  . COLONOSCOPY         No family history on file.  Social History   Tobacco Use  . Smoking status: Never Smoker  . Smokeless tobacco: Never Used  Substance Use Topics  . Alcohol use: Never  . Drug use: Never    Home Medications Prior to Admission medications   Medication Sig Start Date End Date Taking? Authorizing Provider  ciprofloxacin (CIPRO) 500 MG tablet Take 1 tablet (500 mg total) by mouth 2 (two) times daily. Patient not taking: Reported on 12/16/2019 06/04/16   [name], PA-C  ibuprofen (ADVIL,MOTRIN) 600 MG tablet Take 1 tablet (600 mg total) by mouth every 6 (six) hours as needed. Patient not taking: Reported on 12/16/2019 06/04/16   [name], PA-C  senna-docusate (SENOKOT-S) 8.6-50 MG tablet Take 1 tablet by mouth at bedtime as needed for mild constipation or moderate constipation. Patient not taking: Reported on 12/16/2019 08/23/19   Long, [name], MD    Allergies    Patient has no known allergies.  Review of Systems   Review of Systems  All other systems reviewed and are  negative.   Physical Exam Updated Vital Signs BP (!) 156/104 (BP Location: Left Arm)   Pulse 77   Temp 98.5 F (36.9 C)   Resp 16   Ht 6\' 6"  (1.981 m)   Wt 90.7 kg   SpO2 100%   BMI 23.11 kg/m   Physical Exam Vitals and nursing note reviewed.  Constitutional:      Appearance: He is well-developed and well-nourished.  HENT:     Head: Normocephalic and atraumatic.     Right Ear: External ear normal.     Left Ear: External ear normal.  Eyes:     Extraocular Movements: EOM normal.     Conjunctiva/sclera: Conjunctivae normal.     Pupils: Pupils are equal, round, and reactive to light.  Neck:     Trachea: Phonation normal.  Cardiovascular:     Rate and Rhythm: Normal rate.  Pulmonary:     Effort: Pulmonary effort is normal. No respiratory distress.     Breath sounds: No stridor.  Chest:     Chest wall: No bony tenderness.  Abdominal:     General: There is no distension.     Palpations: Abdomen is soft.  Musculoskeletal:        General: Normal range of motion.     Cervical back: Normal range of motion and neck supple.  Skin:    General: Skin is warm, dry and  intact.  Neurological:     Mental Status: He is alert and oriented to person, place, and time.     Cranial Nerves: No cranial nerve deficit.     Sensory: No sensory deficit.     Motor: No abnormal muscle tone.     Coordination: Coordination normal.  Psychiatric:        Mood and Affect: Mood and affect and mood normal.        Behavior: Behavior normal.        Thought Content: Thought content normal.        Judgment: Judgment normal.     ED Results / Procedures / Treatments   Labs (all labs ordered are listed, but only abnormal results are displayed) Labs Reviewed - No data to display  EKG None  Radiology No results found.  Procedures Procedures   Medications Ordered in ED Medications - No data to display  ED Course  I have reviewed the triage vital signs and the nursing notes.  Pertinent labs  & imaging results that were available during my care of the patient were reviewed by me and considered in my medical decision making (see chart for details).    MDM Rules/Calculators/A&P                           Patient Vitals for the past 24 hrs:  BP Temp Pulse Resp SpO2 Height Weight  11/05/20 1340 -- -- -- -- -- 6\' 6"  (1.981 m) 90.7 kg  11/05/20 1339 (!) 156/104 98.5 F (36.9 C) 77 16 100 % -- --    2:25 PM Reevaluation with update and discussion. After initial assessment and treatment, an updated evaluation reveals no further complaints, findings cussed with patient all questions were answered. 11/07/20   Medical Decision Making:  This patient is presenting for evaluation of acute illness which is spontaneously resolving, which does require a range of treatment options, and is a complaint that involves a moderate risk of morbidity and mortality. The differential diagnoses include gastroenteritis, food poisoning. I decided to review old records, and in summary he has had vomiting and diarrhea since binge eating yesterday.  No worrisome signs for infectious process..  I did not require additional historical information from anyone.    Critical Interventions-clinical evaluation, discussion with patient  After These Interventions, the Patient was reevaluated and was found stable for discharge.  Nonspecific vomiting diarrhea, symptoms are mild and gradually improving.  No indication for ED intervention.  Patient can return to work tomorrow.  CRITICAL CARE-no Performed by: Mancel Bale  Nursing Notes Reviewed/ Care Coordinated Applicable Imaging Reviewed Interpretation of Laboratory Data incorporated into ED treatment  The patient appears reasonably screened and/or stabilized for discharge and I doubt any other medical condition or other [facility] requiring further screening, evaluation, or treatment in the ED at this time prior to discharge.  Plan: Home Medications-symptomatic  care; Home Treatments-push fluids; return here if the recommended treatment, does not improve the symptoms; Recommended follow up-PCP,.     Final Clinical Impression(s) / ED Diagnoses Final diagnoses:  None    Rx / DC Orders ED Discharge Orders    None       [facility], MD 11/05/20 [phone]

## 2021-03-14 IMAGING — CT CT ABD-PELV W/ CM
2 of 4 series · 16 of 46 positions shown, 18 images · IV contrast (APPLIED)
Comparison: None.

CLINICAL DATA: Acute abdominal pain. Generalized rectal pain and GI
bleeding. GI bleeding symptoms for 2 weeks.

EXAM:
CT ABDOMEN AND PELVIS WITH CONTRAST
TECHNIQUE: Multidetector CT imaging of the abdomen and pelvis was performed
using the standard protocol following bolus administration of
intravenous contrast.
CONTRAST:  <See Chart> OMNIPAQUE IOHEXOL 300 MG/ML  SOLN

[Series 3: abd/ pelvis 5.0 i30f 2 · axial · 0.69mm/px · z∈[+736,+1191]mm · 13 of 99 slices shown, 15 images]
[im 4/99  soft-tissue]
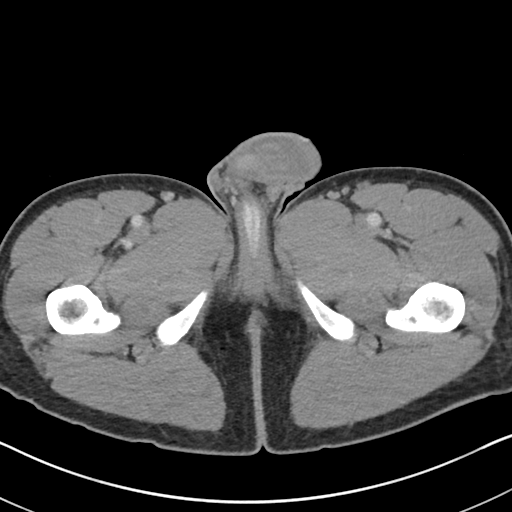
[im 4/99  bone]
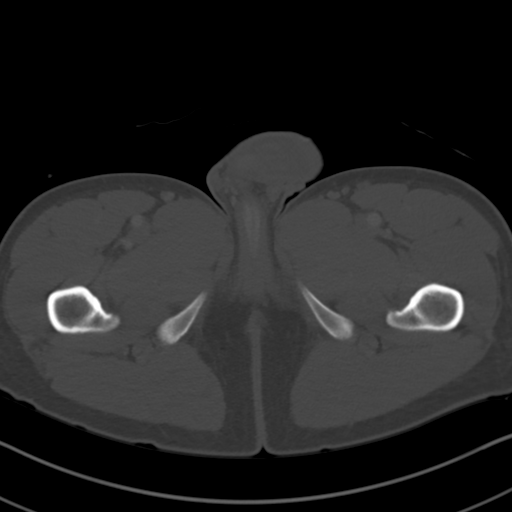
[im 12/99  soft-tissue]
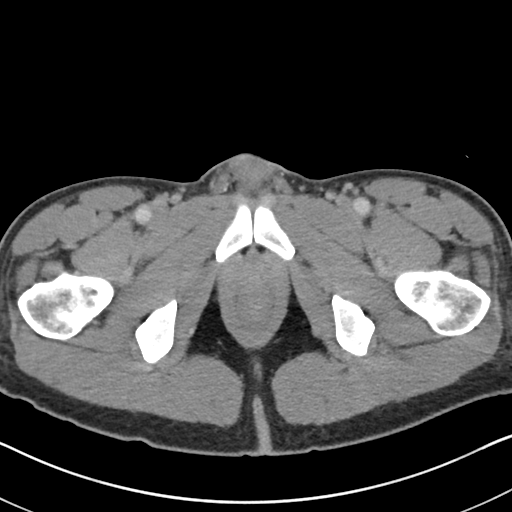
[im 20/99  soft-tissue]
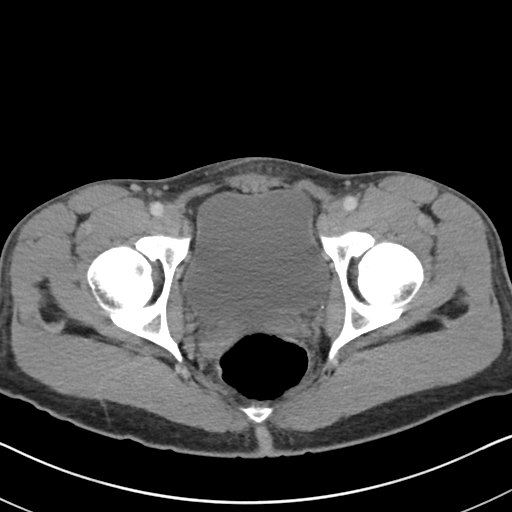
[im 28/99  soft-tissue]
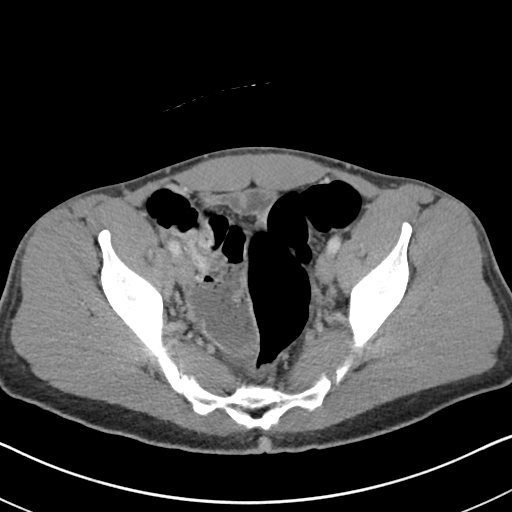
[im 36/99  soft-tissue]
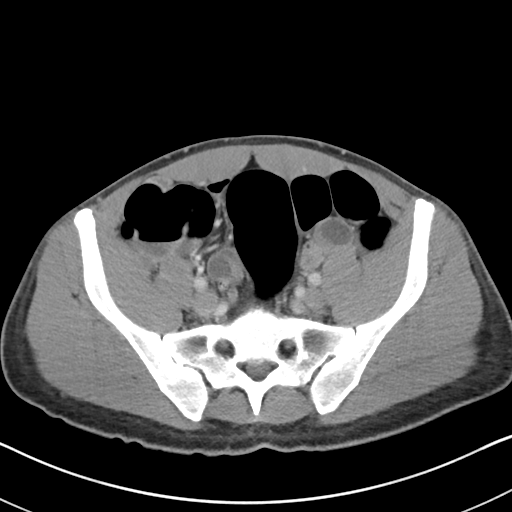
[im 44/99  soft-tissue]
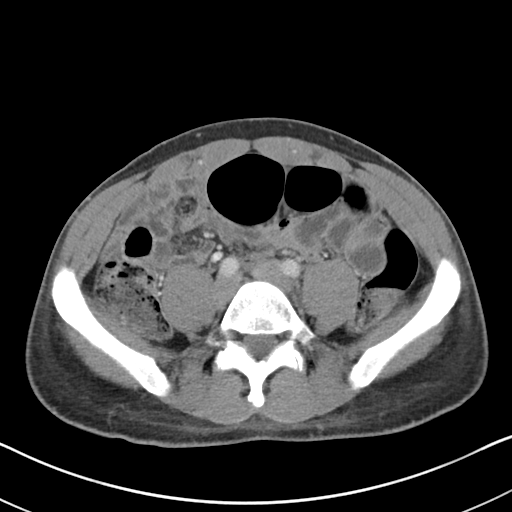
[im 51/99  soft-tissue]
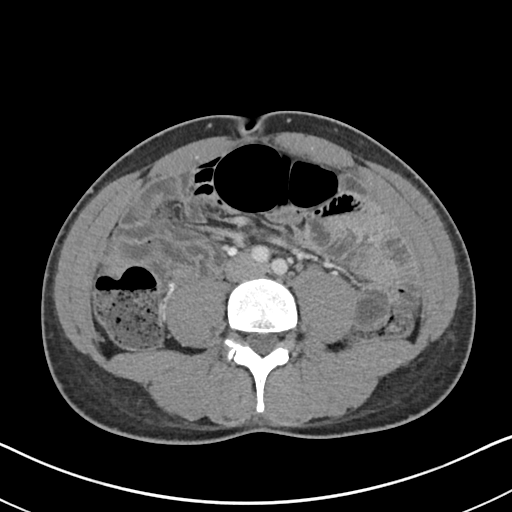
[im 55/99  soft-tissue]
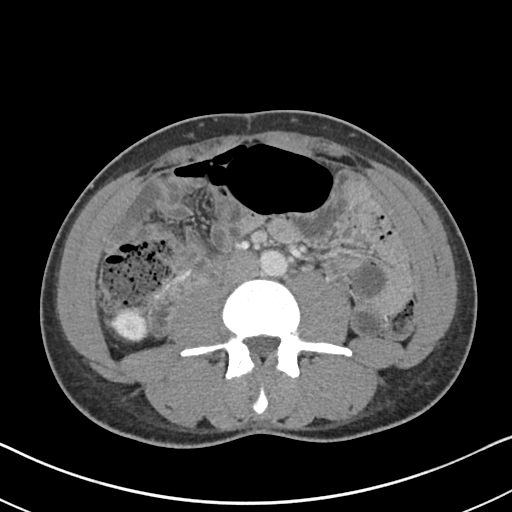
[im 63/99  soft-tissue]
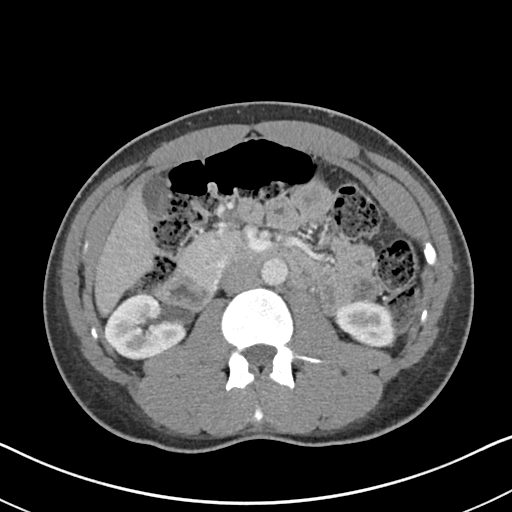
[im 63/99  bone]
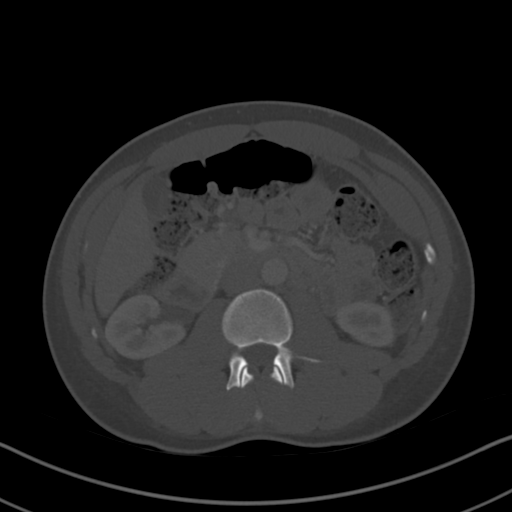
[im 71/99  soft-tissue]
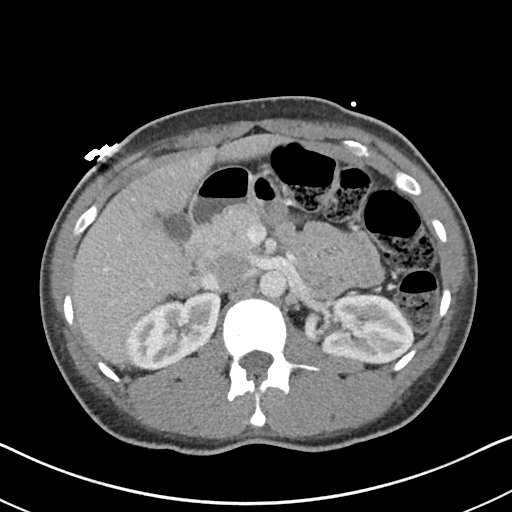
[im 79/99  soft-tissue]
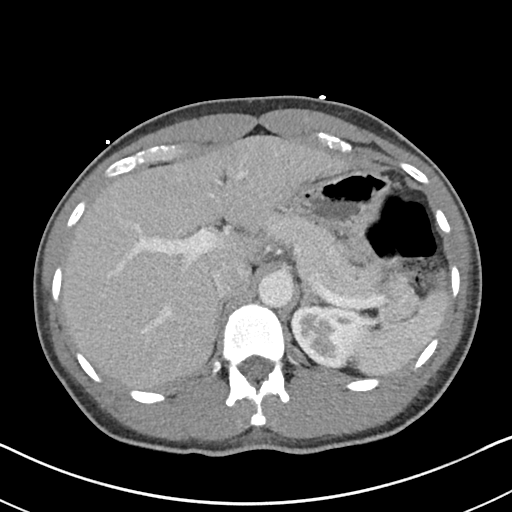
[im 87/99  soft-tissue]
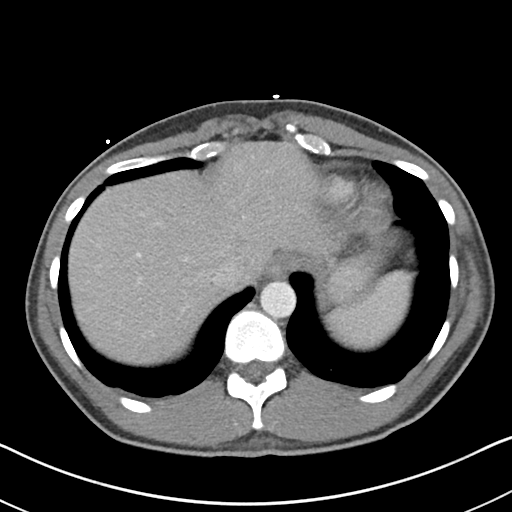
[im 95/99  soft-tissue]
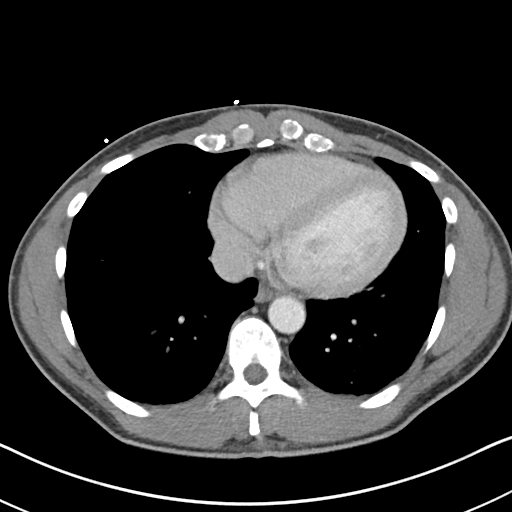

[Series 6: coronal soft tissue · coronal · 0.74mm/px · 3 of 101 slices shown]
[im 34/101  soft-tissue]
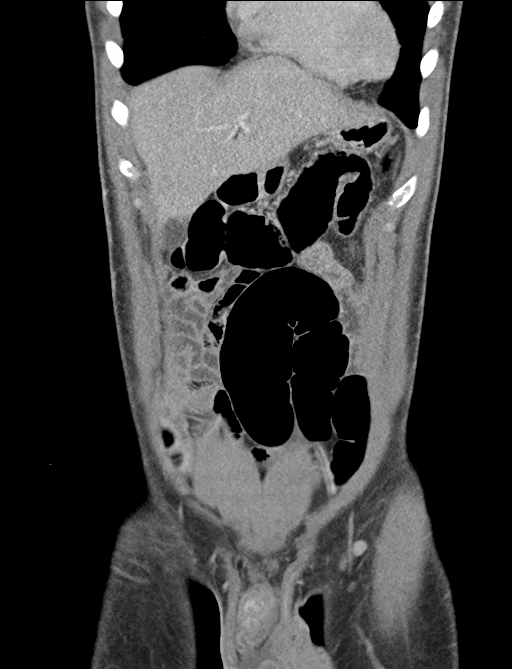
[im 45/101  soft-tissue]
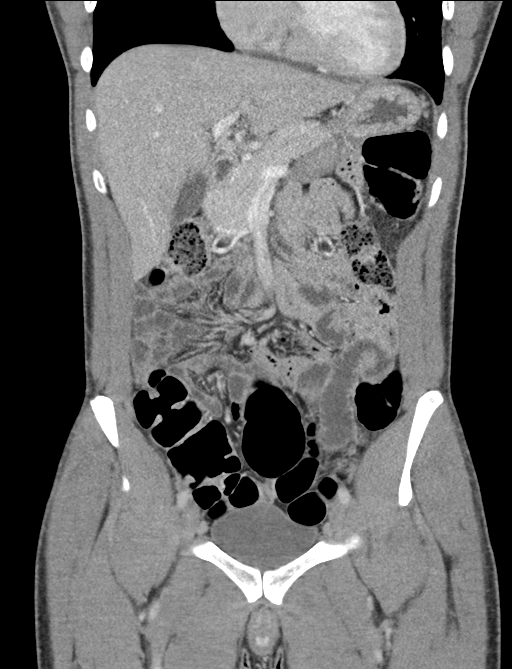
[im 56/101  soft-tissue]
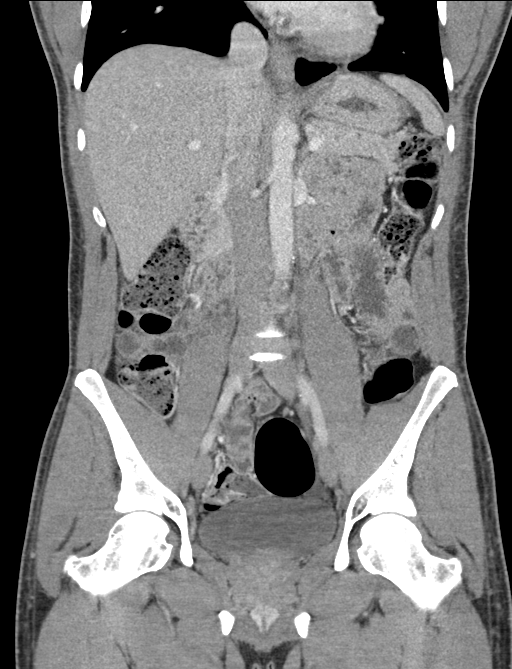

[16 of 46 positions shown; findings below may reference images not displayed]

FINDINGS: Lower chest: Clear lung bases.

Hepatobiliary: No focal liver abnormality is seen. No gallstones,
gallbladder wall thickening, or biliary dilatation.

Pancreas: Unremarkable. No pancreatic ductal dilatation or
surrounding inflammatory changes.

Spleen: Normal in size without focal abnormality.

Adrenals/Urinary Tract: Adrenal glands are unremarkable. Kidneys are
normal, without renal calculi, focal lesion, or hydronephrosis.
Bladder is unremarkable.

Stomach/Bowel: Moderate increased stool noted throughout the colon.
No colonic wall thickening or inflammation. No mass or abnormality
noted of the rectum.

Stomach and small bowel are unremarkable.

Normal appendix visualized.

Vascular/Lymphatic: Minimal aortic atherosclerosis. No other
vascular abnormality. No enlarged lymph nodes.

Reproductive: Unremarkable.

Other: No abdominal wall hernia or abnormality. Trace free fluid in
the posterior pelvic recess.

Musculoskeletal: No acute or significant osseous findings.
IMPRESSION: 1. No acute findings.
2. Moderate increased stool burden throughout the colon. No bowel
inflammation. No CT abnormality of the rectum.

## 2021-07-07 IMAGING — DX DG CHEST 2V
2 series · 2 of 2 positions shown · non-contrast
Comparison: None.

CLINICAL DATA: 38-year-old male with chest pain.

EXAM:
CHEST - 2 VIEW

[chest pa]
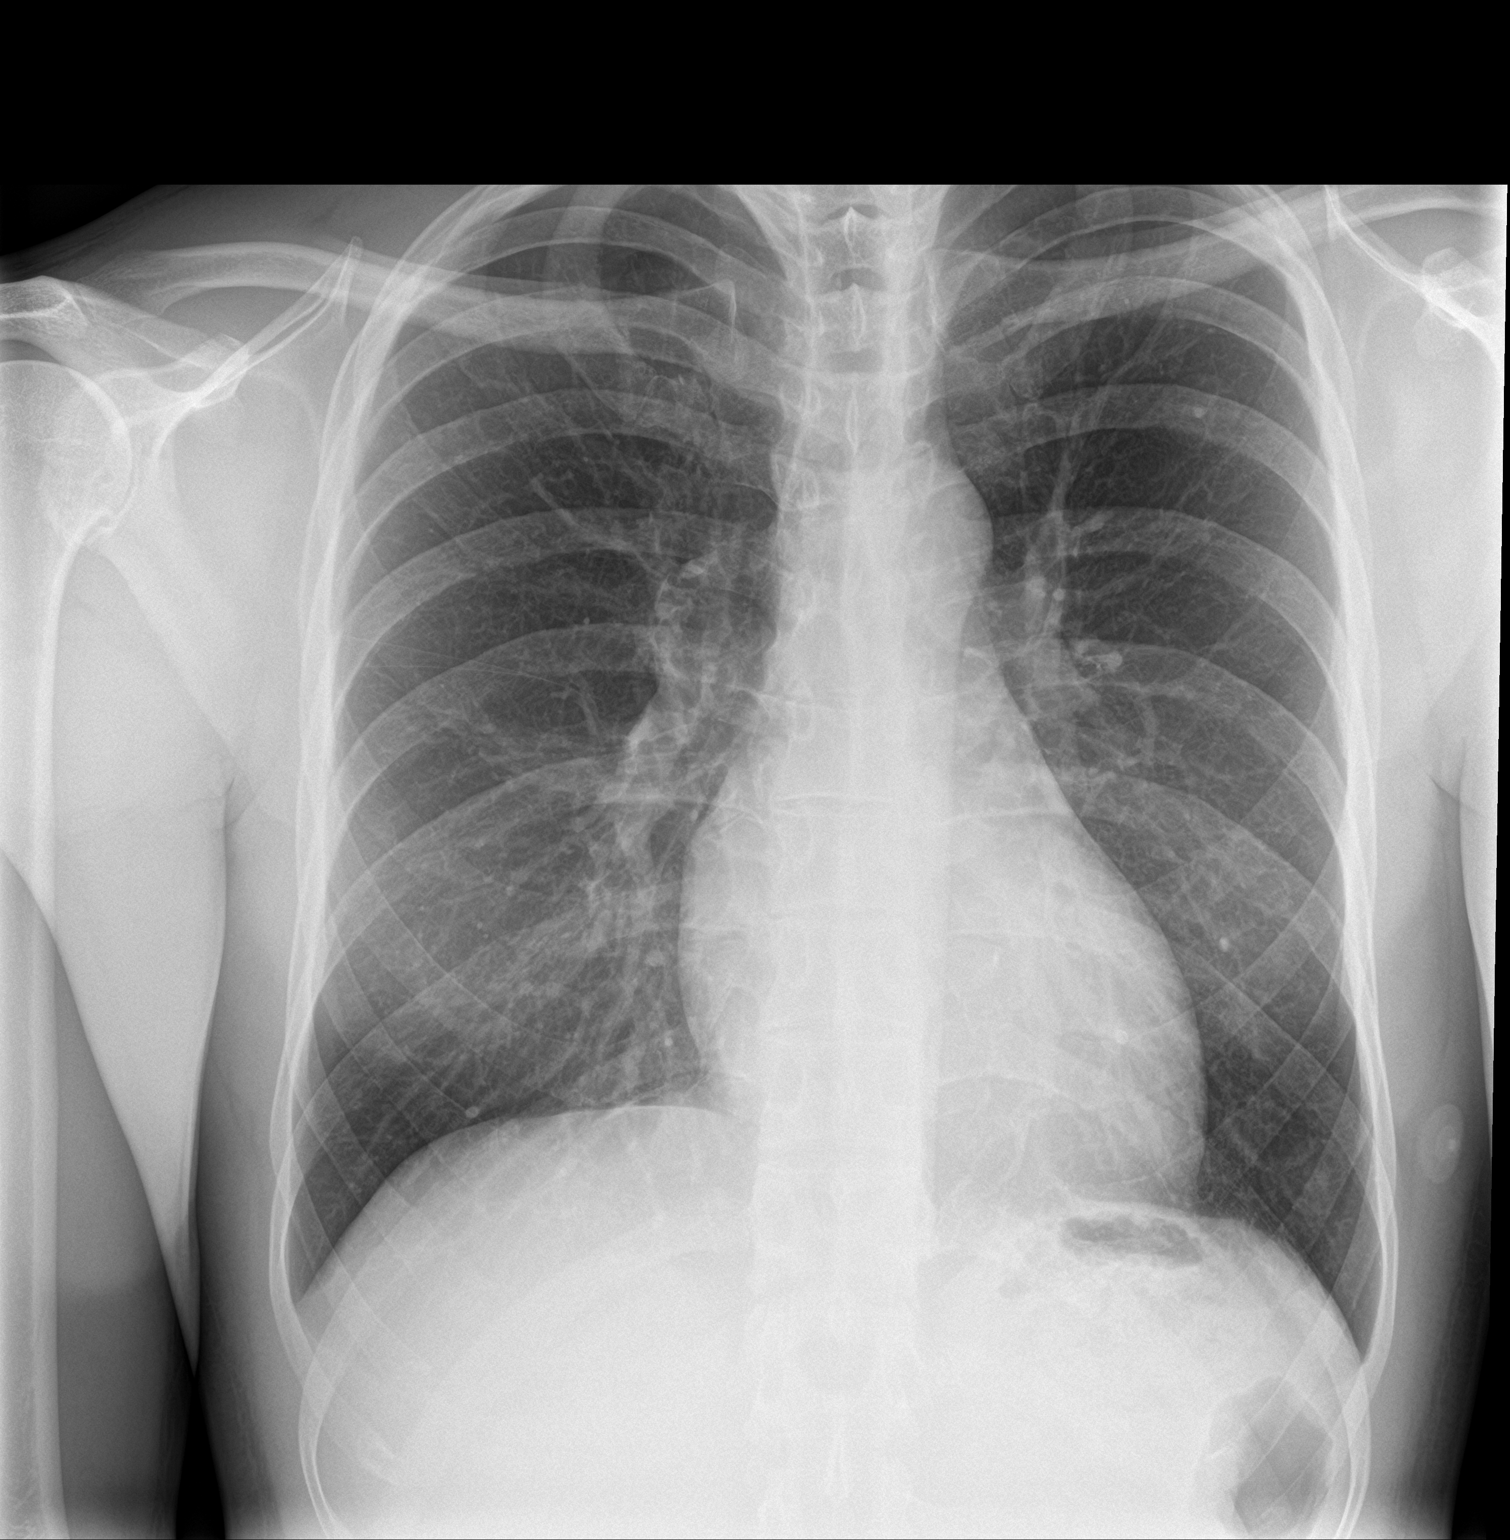

[chest lat]
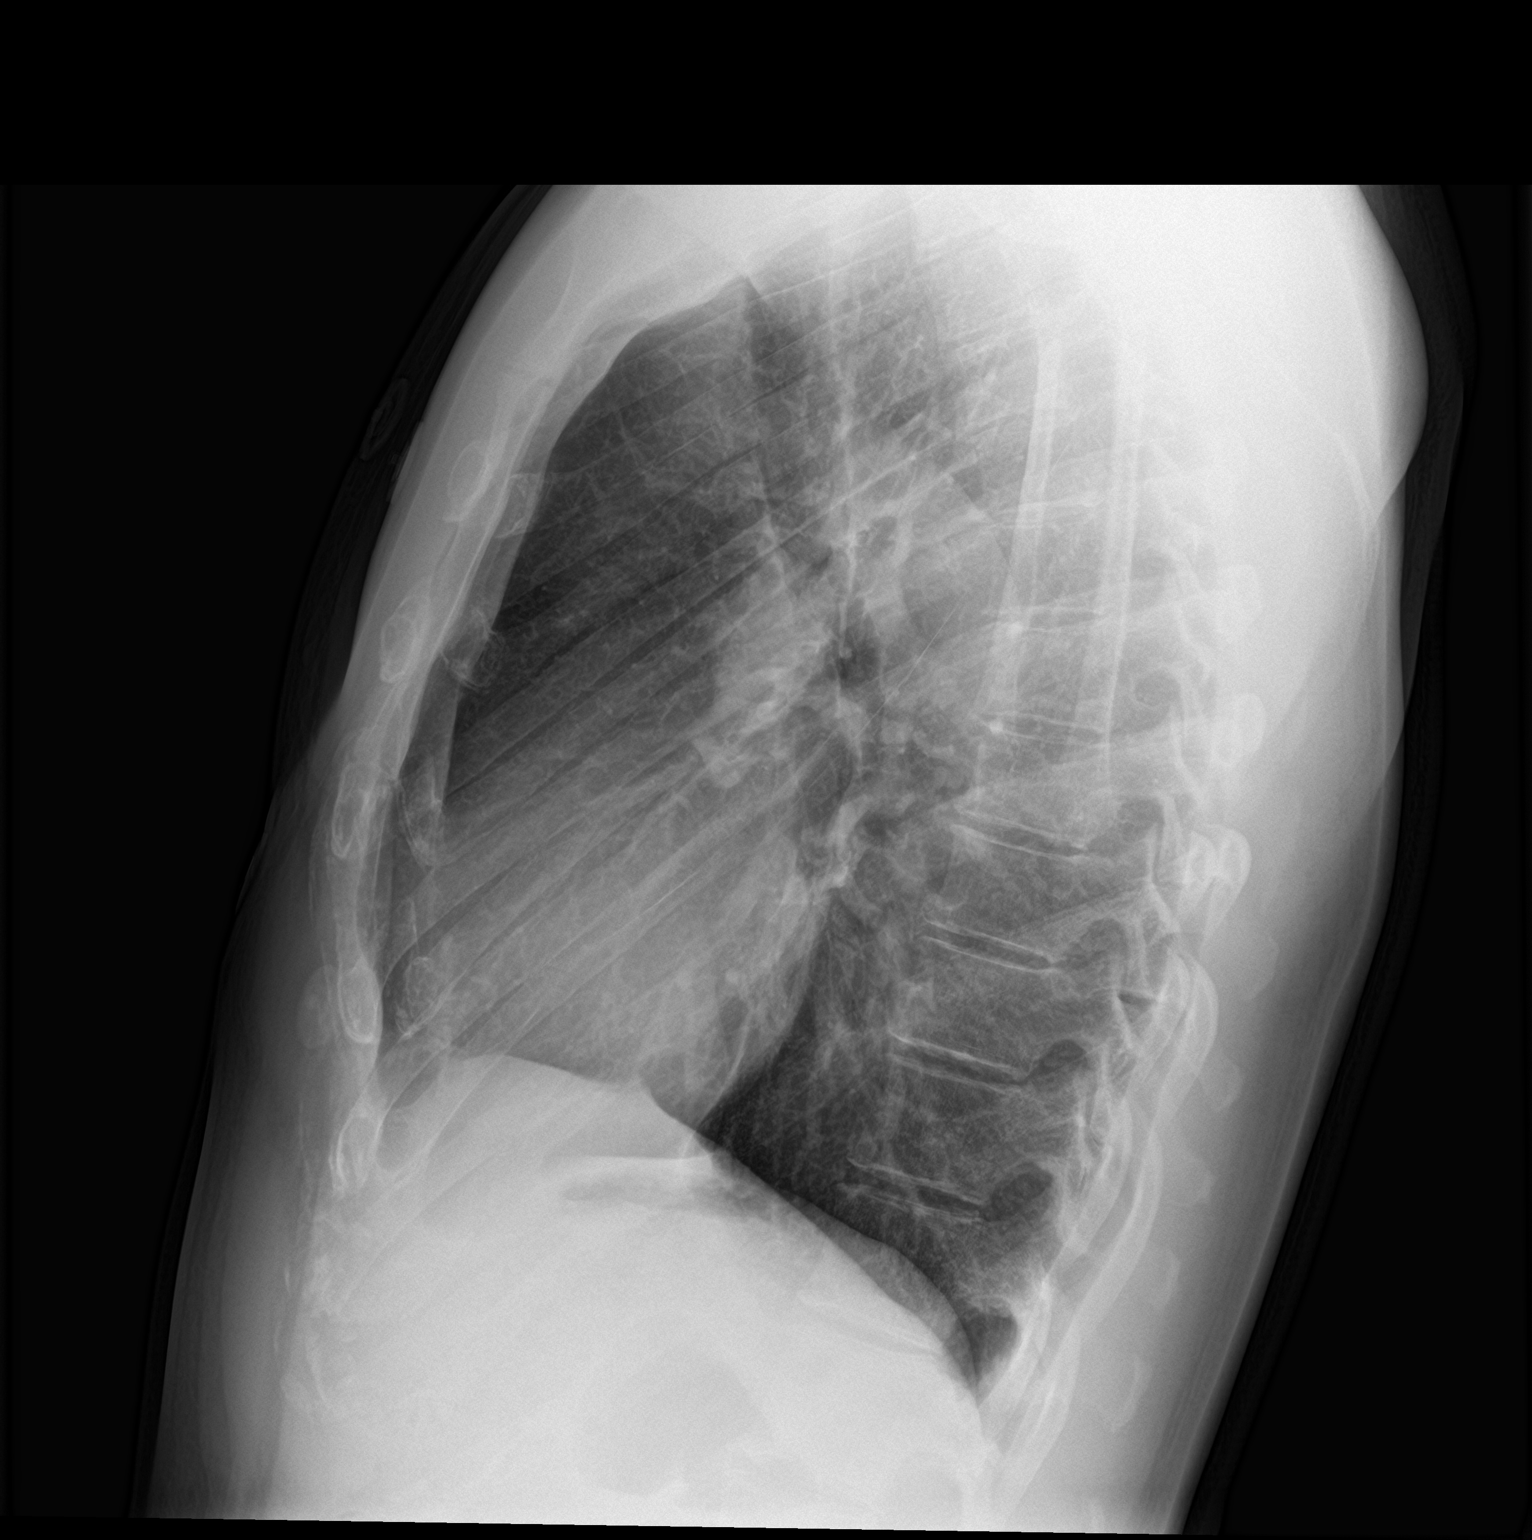

[2 of 2 positions shown; findings below may reference images not displayed]

FINDINGS: The heart size and mediastinal contours are within normal limits.
Both lungs are clear. The visualized skeletal structures are
unremarkable.
IMPRESSION: No active cardiopulmonary disease.

## 2022-05-08 ENCOUNTER — Other Ambulatory Visit: Payer: Self-pay

## 2022-05-08 ENCOUNTER — Emergency Department (HOSPITAL_COMMUNITY)
Admission: EM | Admit: 2022-05-08 | Discharge: 2022-05-08 | Disposition: A | Discharge status: Home / Self Care | Payer: BC Managed Care – PPO | Attending: Emergency Medicine | Admitting: Emergency Medicine

## 2022-05-08 ENCOUNTER — Encounter (HOSPITAL_COMMUNITY): Payer: Self-pay

## 2022-05-08 DIAGNOSIS — K529 Noninfective gastroenteritis and colitis, unspecified: Secondary | ICD-10-CM | POA: Insufficient documentation

## 2022-05-08 DIAGNOSIS — R1011 Right upper quadrant pain: Secondary | ICD-10-CM | POA: Diagnosis present

## 2022-05-08 LAB — COMPREHENSIVE METABOLIC PANEL
ALT: 17 U/L (ref 0–44)
AST: 22 U/L (ref 15–41)
Albumin: 4.2 g/dL (ref 3.5–5.0)
Alkaline Phosphatase: 46 U/L (ref 38–126)
Anion gap: 5 (ref 5–15)
BUN: 8 mg/dL (ref 6–20)
CO2: 30 mmol/L (ref 22–32)
Calcium: 9.6 mg/dL (ref 8.9–10.3)
Chloride: 106 mmol/L (ref 98–111)
Creatinine, Ser: 1.03 mg/dL (ref 0.61–1.24)
GFR, Estimated: >60 mL/min (ref >60)
Glucose, Bld: 111 mg/dL — ABNORMAL HIGH (ref 70–99)
Potassium: 4.4 mmol/L (ref 3.5–5.1)
Sodium: 141 mmol/L (ref 135–145)
Total Bilirubin: 0.5 mg/dL (ref 0.3–1.2)
Total Protein: 7.7 g/dL (ref 6.5–8.1)

## 2022-05-08 LAB — CBC
HCT: 40.4 % (ref 39.0–52.0)
Hemoglobin: 12.9 g/dL — ABNORMAL LOW (ref 13.0–17.0)
MCH: 28.3 pg (ref 26.0–34.0)
MCHC: 31.9 g/dL (ref 30.0–36.0)
MCV: 88.6 fL (ref 80.0–100.0)
Platelets: 178 10*3/uL (ref 150–400)
RBC: 4.56 MIL/uL (ref 4.22–5.81)
RDW: 15 % (ref 11.5–15.5)
WBC: 5.6 10*3/uL (ref 4.0–10.5)
nRBC: 0 % (ref 0.0–0.2)

## 2022-05-08 LAB — URINALYSIS, ROUTINE W REFLEX MICROSCOPIC
Bilirubin Urine: NEGATIVE
Glucose, UA: NEGATIVE mg/dL
Hgb urine dipstick: NEGATIVE
Ketones, ur: NEGATIVE mg/dL
Leukocytes,Ua: NEGATIVE
Nitrite: NEGATIVE
Protein, ur: NEGATIVE mg/dL
Specific Gravity, Urine: 1.016 (ref 1.005–1.030)
pH: 7 (ref 5.0–8.0)

## 2022-05-08 LAB — LIPASE, BLOOD: Lipase: 30 U/L (ref 11–51)

## 2022-05-08 MED ORDER — SODIUM CHLORIDE 0.9 % IV BOLUS
1000.0000 mL | Freq: Once | INTRAVENOUS | Status: AC
  Administered 2022-05-08: 1000 mL via INTRAVENOUS

## 2022-05-08 MED ORDER — ONDANSETRON HCL 4 MG/2ML IJ SOLN
4.0000 mg | Freq: Once | INTRAMUSCULAR | Status: AC
  Administered 2022-05-08: 4 mg via INTRAVENOUS
  Filled 2022-05-08: qty 2

## 2022-05-08 MED ORDER — ONDANSETRON 4 MG PO TBDP: 4.0000 mg | ORAL_TABLET | Freq: Three times a day (TID) | ORAL | 0 refills | Status: AC | PRN

## 2022-05-08 NOTE — Discharge Instructions (Addendum)
If you develop worsening, continued, or recurrent abdominal pain, uncontrolled vomiting, fever, chest or back pain, or any other new/concerning symptoms then return to the ER for evaluation.  

## 2022-05-08 NOTE — ED Provider Notes (Signed)
[facility]-EMERGENCY DEPT Provider Note   CSN: [id number] Arrival date & time: 05/08/22  1021     History  Chief Complaint  Patient presents with   Abdominal Pain   Emesis    Matthewjames Petrasek is a 41 y.o. male.  HPI 42 year old male presents with vomiting and diarrhea.  Started off with some vomiting 3 days ago but everything acutely worsened last night.  He had episodes of vomiting and diarrhea but no blood.  No fevers.  Upper abdomen feels sore and still nauseous.  He states that when he told work he had to call out, they told him that multiple people have a GI bug at work.  Home Medications Prior to Admission medications   Medication Sig Start Date End Date Taking? Authorizing Provider  ondansetron (ZOFRAN-ODT) 4 MG disintegrating tablet Take 1 tablet (4 mg total) by mouth every 8 (eight) hours as needed for nausea or vomiting. 05/08/22  Yes Pricilla Loveless, MD  ciprofloxacin (CIPRO) 500 MG tablet Take 1 tablet (500 mg total) by mouth 2 (two) times daily. Patient not taking: Reported on 12/16/2019 06/04/16   [name], PA-C  ibuprofen (ADVIL,MOTRIN) 600 MG tablet Take 1 tablet (600 mg total) by mouth every 6 (six) hours as needed. Patient not taking: Reported on 12/16/2019 06/04/16   [name], PA-C  senna-docusate (SENOKOT-S) 8.6-50 MG tablet Take 1 tablet by mouth at bedtime as needed for mild constipation or moderate constipation. Patient not taking: Reported on 12/16/2019 08/23/19   Long, [name], MD      Allergies    Patient has no known allergies.    Review of Systems   Review of Systems  Constitutional:  Negative for fever.  Gastrointestinal:  Positive for abdominal pain, diarrhea, nausea and vomiting. Negative for blood in stool.    Physical Exam Updated Vital Signs BP (!) 137/96 (BP Location: Right Arm)   Pulse (!) 59   Temp 97.8 F (36.6 C) (Oral)   Resp 16   SpO2 100%  Physical Exam Vitals and nursing note  reviewed.  Constitutional:      Appearance: He is well-developed.  HENT:     Head: Normocephalic and atraumatic.  Cardiovascular:     Rate and Rhythm: Normal rate and regular rhythm.     Heart sounds: Normal heart sounds.  Pulmonary:     Effort: Pulmonary effort is normal.     Breath sounds: Normal breath sounds.  Abdominal:     Palpations: Abdomen is soft.     Tenderness: There is abdominal tenderness (mild) in the right upper quadrant, epigastric area and left upper quadrant.  Skin:    General: Skin is warm and dry.  Neurological:     Mental Status: He is alert.     ED Results / Procedures / Treatments   Labs (all labs ordered are listed, but only abnormal results are displayed) Labs Reviewed  COMPREHENSIVE METABOLIC PANEL - Abnormal; Notable for the following components:      Result Value   Glucose, Bld 111 (*)    All other components within normal limits  CBC - Abnormal; Notable for the following components:   Hemoglobin 12.9 (*)    All other components within normal limits  LIPASE, BLOOD  URINALYSIS, ROUTINE W REFLEX MICROSCOPIC    EKG None  Radiology No results found.  Procedures Procedures    Medications Ordered in ED Medications  sodium chloride 0.9 % bolus 1,000 mL (1,000 mLs Intravenous New Bag/Given 05/08/22  1539)  ondansetron (ZOFRAN) injection 4 mg (4 mg Intravenous Given 05/08/22 1540)    ED Course/ Medical Decision Making/ A&P                           Medical Decision Making Amount and/or Complexity of Data Reviewed Labs: ordered.  Risk Prescription drug management.   Presentation is most consistent with acute gastroenteritis.  I suspect this is viral.  Mild anemia but otherwise lab work unremarkable.  Renal functions okay.  Lipase, LFTs, white blood cell count all normal.  Urinalysis benign.  He had a little bit of upper abdominal discomfort but after Zofran that has resolved.  I suspect that is more nausea than anything else.  I do not  think an emergent imaging is helpful and I think this is gastroenteritis and he can be discharged.  He feels a lot better and wants to go.  Will prescribe Zofran and give a work note.  Given return precautions.        Final Clinical Impression(s) / ED Diagnoses Final diagnoses:  Acute gastroenteritis    Rx / DC Orders ED Discharge Orders          Ordered    ondansetron (ZOFRAN-ODT) 4 MG disintegrating tablet  Every 8 hours PRN        05/08/22 1700              Pricilla Loveless, MD 05/08/22 1701

## 2022-05-08 NOTE — ED Provider Triage Note (Signed)
Emergency Medicine Provider Triage Evaluation Note  Kenneth Cuaresma , a 41 y.o. male  was evaluated in triage.  Pt complains of abdominal discomfort with 3 episodes of vomiting, nausea, and diarrhea over three days. Denies any dysuria or fever.  Review of Systems  Positive:  Negative:   Physical Exam  BP (!) 142/97 (BP Location: Left Arm)   Pulse 67   Temp 98.6 F (37 C) (Oral)   Resp 18   SpO2 100%  Gen:   Awake, no distress   Resp:  Normal effort  MSK:   Moves extremities without difficulty  Other:  Abdomen soft. Non tender. Patient NAD.  Medical Decision Making  Medically screening exam initiated at 11:24 AM.  Appropriate orders placed.  Mahdi Frye was informed that the remainder of the evaluation will be completed by another provider, this initial triage assessment does not replace that evaluation, and the importance of remaining in the ED until their evaluation is complete.  Abdominal labs ordered   Achille Rich, Cordelia Poche 05/08/22 1128

## 2022-05-08 NOTE — ED Triage Notes (Addendum)
Pt reports abdominal pain and N/V/D x3 days.

## 2022-09-19 DIAGNOSIS — J029 Acute pharyngitis, unspecified: Secondary | ICD-10-CM | POA: Insufficient documentation

## 2022-09-19 DIAGNOSIS — Z5321 Procedure and treatment not carried out due to patient leaving prior to being seen by health care provider: Secondary | ICD-10-CM | POA: Insufficient documentation

## 2022-09-20 ENCOUNTER — Emergency Department (HOSPITAL_COMMUNITY)
Admission: EM | Admit: 2022-09-20 | Discharge: 2022-09-20 | Discharge status: Against Medical Advice | Payer: Self-pay | Attending: Emergency Medicine | Admitting: Emergency Medicine

## 2022-09-20 ENCOUNTER — Emergency Department (HOSPITAL_COMMUNITY): Payer: Self-pay

## 2022-09-20 ENCOUNTER — Other Ambulatory Visit: Payer: Self-pay

## 2022-09-20 LAB — GROUP A STREP BY PCR: Group A Strep by PCR: NOT DETECTED

## 2022-09-20 NOTE — ED Triage Notes (Signed)
Patient BIB [facility] for evaluation of mouth/throat lesion.  States he has a sore throat and is "throwing up blood."  Reports "it feels like my throat is on fire."  No reports of fever.  Redness noted to throat.  Maintaining airway in triage

## 2022-09-21 ENCOUNTER — Emergency Department (HOSPITAL_COMMUNITY)
Admission: EM | Admit: 2022-09-21 | Discharge: 2022-09-22 | Disposition: A | Discharge status: Home / Self Care | Payer: Self-pay | Attending: Emergency Medicine | Admitting: Emergency Medicine

## 2022-09-21 ENCOUNTER — Other Ambulatory Visit: Payer: Self-pay

## 2022-09-21 ENCOUNTER — Encounter (HOSPITAL_COMMUNITY): Payer: Self-pay

## 2022-09-21 DIAGNOSIS — J029 Acute pharyngitis, unspecified: Secondary | ICD-10-CM | POA: Insufficient documentation

## 2022-09-21 DIAGNOSIS — Z1152 Encounter for screening for COVID-19: Secondary | ICD-10-CM | POA: Insufficient documentation

## 2022-09-21 DIAGNOSIS — K920 Hematemesis: Secondary | ICD-10-CM

## 2022-09-21 NOTE — ED Provider Triage Note (Signed)
Emergency Medicine Provider Triage Evaluation Note  Anthony Bradford , a 41 y.o. male  was evaluated in triage.  Pt complains of hemoptysis, sore throat.  Patient reports since began yesterday.  Patient was seen for this, tested negative for strep and discharged home.  Patient states has been taking Robitussin without relief.  Patient unsure of sick contacts.  Patient denies nausea or vomiting.  Patient denies trouble swallowing.  Review of Systems  Positive:  Negative:   Physical Exam  BP (!) 171/120 (BP Location: Right Arm)   Pulse (!) 106   Temp 98.2 F (36.8 C) (Oral)   Resp 18   Ht 6\' 6"  (1.981 m)   Wt 90.7 kg   SpO2 99%   BMI 23.11 kg/m  Gen:   Awake, no distress   Resp:  Normal effort  MSK:   Moves extremities without difficulty  Other:    Medical Decision Making  Medically screening exam initiated at 11:34 PM.  Appropriate orders placed.  Taji Barretto was informed that the remainder of the evaluation will be completed by another provider, this initial triage assessment does not replace that evaluation, and the importance of remaining in the ED until their evaluation is complete.     Thomasene Mohair, PA-C 09/21/22 2335

## 2022-09-21 NOTE — ED Triage Notes (Signed)
Pt states that he has been coughing up blood yesterday. Pt reports initially having a sore throat. Pt states that he was here and left because he had to go to work.

## 2022-09-22 LAB — CBC
HCT: 41.7 % (ref 39.0–52.0)
Hemoglobin: 13.3 g/dL (ref 13.0–17.0)
MCH: 28.3 pg (ref 26.0–34.0)
MCHC: 31.9 g/dL (ref 30.0–36.0)
MCV: 88.7 fL (ref 80.0–100.0)
Platelets: 237 10*3/uL (ref 150–400)
RBC: 4.7 MIL/uL (ref 4.22–5.81)
RDW: 14.6 % (ref 11.5–15.5)
WBC: 5.7 10*3/uL (ref 4.0–10.5)
nRBC: 0 % (ref 0.0–0.2)

## 2022-09-22 LAB — RESP PANEL BY RT-PCR (RSV, FLU A&B, COVID)  RVPGX2
Influenza A by PCR: NEGATIVE
Influenza B by PCR: NEGATIVE
Resp Syncytial Virus by PCR: NEGATIVE
SARS Coronavirus 2 by RT PCR: NEGATIVE

## 2022-09-22 LAB — BASIC METABOLIC PANEL
Anion gap: 9 (ref 5–15)
BUN: 12 mg/dL (ref 6–20)
CO2: 28 mmol/L (ref 22–32)
Calcium: 9.7 mg/dL (ref 8.9–10.3)
Chloride: 103 mmol/L (ref 98–111)
Creatinine, Ser: 1.17 mg/dL (ref 0.61–1.24)
GFR, Estimated: >60 mL/min (ref >60)
Glucose, Bld: 114 mg/dL — ABNORMAL HIGH (ref 70–99)
Potassium: 4.4 mmol/L (ref 3.5–5.1)
Sodium: 140 mmol/L (ref 135–145)

## 2022-09-22 MED ORDER — SUCRALFATE 1 G PO TABS: 1.0000 g | ORAL_TABLET | Freq: Three times a day (TID) | ORAL | 0 refills | Status: AC

## 2022-09-22 MED ORDER — PANTOPRAZOLE SODIUM 20 MG PO TBEC: 20.0000 mg | DELAYED_RELEASE_TABLET | Freq: Every day | ORAL | 0 refills | Status: AC

## 2022-09-22 NOTE — ED Provider Notes (Signed)
Anthony Bradford Provider Note   CSN: [id number] Arrival date & time: 09/21/22  2316     History  Chief Complaint  Patient presents with   Hematemesis   Sore Throat    Anthony Bradford is a 41 y.o. male.  Patient here with sore throat and some blood in some vomit.  No significant major medical problems.  May be symptoms related to eating.  Denies any chest pain or shortness of breath or abdominal pain.  Denies any black or bloody stools.  Denies any heavy alcohol use or ibuprofen use.  Denies any fevers or chills.  Denies any cough or sputum production.  Denies any lightheadedness or weakness or dizziness.  The history is provided by the patient.       Home Medications Prior to Admission medications   Medication Sig Start Date End Date Taking? Authorizing Provider  pantoprazole (PROTONIX) 20 MG tablet Take 1 tablet (20 mg total) by mouth daily for 14 days. 09/22/22 10/06/22 Yes [place] Retana, DO  sucralfate (CARAFATE) 1 g tablet Take 1 tablet (1 g total) by mouth 4 (four) times daily -  with meals and at bedtime for 14 days. 09/22/22 10/06/22 Yes [name], DO  ciprofloxacin (CIPRO) 500 MG tablet Take 1 tablet (500 mg total) by mouth 2 (two) times daily. Patient not taking: Reported on 12/16/2019 06/04/16   [name], PA-C  ibuprofen (ADVIL,MOTRIN) 600 MG tablet Take 1 tablet (600 mg total) by mouth every 6 (six) hours as needed. Patient not taking: Reported on 12/16/2019 06/04/16   [name], PA-C  ondansetron (ZOFRAN-ODT) 4 MG disintegrating tablet Take 1 tablet (4 mg total) by mouth every 8 (eight) hours as needed for nausea or vomiting. 05/08/22   Pricilla Loveless, MD  senna-docusate (SENOKOT-S) 8.6-50 MG tablet Take 1 tablet by mouth at bedtime as needed for mild constipation or moderate constipation. Patient not taking: Reported on 12/16/2019 08/23/19   [name], [name], MD      Allergies    Patient has no known  allergies.    Review of Systems   Review of Systems  Physical Exam Updated Vital Signs BP (!) 163/100   Pulse 67   Temp 98.3 F (36.8 C) (Oral)   Resp 18   Ht 6\' 6"  (1.981 m)   Wt 90.7 kg   SpO2 99%   BMI 23.11 kg/m  Physical Exam Vitals and nursing note reviewed.  Constitutional:      General: He is not in acute distress.    Appearance: He is well-developed.  HENT:     Head: Normocephalic and atraumatic.     Mouth/Throat:     Mouth: Mucous membranes are moist.  Eyes:     Conjunctiva/sclera: Conjunctivae normal.  Cardiovascular:     Rate and Rhythm: Normal rate and regular rhythm.     Heart sounds: No murmur heard. Pulmonary:     Effort: Pulmonary effort is normal. No respiratory distress.     Breath sounds: Normal breath sounds.  Abdominal:     Palpations: Abdomen is soft.     Tenderness: There is no abdominal tenderness.  Musculoskeletal:        General: No swelling.     Cervical back: Normal range of motion and neck supple.  Skin:    General: Skin is warm and dry.     Capillary Refill: Capillary refill takes less than 2 seconds.  Neurological:     Mental Status: He is alert.  Psychiatric:  Mood and Affect: Mood normal.     ED Results / Procedures / Treatments   Labs (all labs ordered are listed, but only abnormal results are displayed) Labs Reviewed  BASIC METABOLIC PANEL - Abnormal; Notable for the following components:      Result Value   Glucose, Bld 114 (*)    All other components within normal limits  RESP PANEL BY RT-PCR (RSV, FLU A&B, COVID)  [id number]  CBC    EKG None  Radiology No results found.  Procedures Procedures    Medications Ordered in ED Medications - No data to display  ED Course/ Medical Decision Making/ A&P                           Medical Decision Making Risk Prescription drug management.   Anthony Bradford is here with sore throat, hematemesis.  Differential diagnosis likely acid reflux/esophagitis/gastritis  versus less likely viral process.  He is very well-appearing.  Unremarkable vitals.  No fever.  He was seen earlier in the night and left without being seen.  He had a strep test that is negative.  Per my further review and interpretation labs he is negative for COVID and flu.  He has no significant anemia or electrolyte abnormality or kidney injury.  Overall I suspect that this is esophagitis/reflux related.  Will start him on Protonix and Carafate.  Patient discharged in good condition.  Referred to GI.  Understands return precautions.  His episode of hematemesis seems very small volume.  He is not having any melena.  He understands return precautions.  Have no concern for GI bleed at this time.  This chart was dictated using voice recognition software.  Despite best efforts to proofread,  errors can occur which can change the documentation meaning.         Final Clinical Impression(s) / ED Diagnoses Final diagnoses:  Sore throat  Hematemesis, unspecified whether nausea present    Rx / DC Orders ED Discharge Orders          Ordered    pantoprazole (PROTONIX) 20 MG tablet  Daily        09/22/22 0758    sucralfate (CARAFATE) 1 g tablet  3 times daily with meals & bedtime        09/22/22 0758              Virgina Norfolk, DO 09/22/22 0800
